# Patient Record
Sex: Female | Born: 1990 | Race: Black or African American | Hispanic: No | Marital: Married | Smoking: Never smoker
Health system: Southern US, Community
[De-identification: ages and names within clinical notes are randomized; demographics above are authoritative.]

## PROBLEM LIST (undated history)

## (undated) DIAGNOSIS — Z789 Other specified health status: Secondary | ICD-10-CM

## (undated) HISTORY — DX: Other specified health status: Z78.9

## (undated) HISTORY — PX: BREAST SURGERY: SHX581

## (undated) HISTORY — PX: WISDOM TOOTH EXTRACTION: SHX21

## (undated) HISTORY — PX: BREAST MASS EXCISION: SHX1267

---

## 2018-03-06 DIAGNOSIS — J392 Other diseases of pharynx: Secondary | ICD-10-CM | POA: Diagnosis not present

## 2018-03-06 DIAGNOSIS — L719 Rosacea, unspecified: Secondary | ICD-10-CM | POA: Diagnosis not present

## 2018-05-07 ENCOUNTER — Other Ambulatory Visit (HOSPITAL_COMMUNITY)
Admission: RE | Admit: 2018-05-07 | Discharge: 2018-05-07 | Disposition: A | Discharge status: Home / Self Care | Payer: BLUE CROSS/BLUE SHIELD | Source: Ambulatory Visit | Attending: Family Medicine | Admitting: Family Medicine

## 2018-05-07 ENCOUNTER — Other Ambulatory Visit: Payer: Self-pay | Admitting: Family Medicine

## 2018-05-07 DIAGNOSIS — Z113 Encounter for screening for infections with a predominantly sexual mode of transmission: Secondary | ICD-10-CM | POA: Diagnosis not present

## 2018-05-07 DIAGNOSIS — Z130 Encounter for screening for diseases of the blood and blood-forming organs and certain disorders involving the immune mechanism: Secondary | ICD-10-CM | POA: Diagnosis not present

## 2018-05-07 DIAGNOSIS — Z124 Encounter for screening for malignant neoplasm of cervix: Secondary | ICD-10-CM | POA: Diagnosis not present

## 2018-05-07 DIAGNOSIS — Z0000 Encounter for general adult medical examination without abnormal findings: Secondary | ICD-10-CM | POA: Diagnosis not present

## 2018-05-11 LAB — CYTOLOGY - PAP

## 2018-05-27 DIAGNOSIS — R0789 Other chest pain: Secondary | ICD-10-CM | POA: Diagnosis not present

## 2018-05-27 DIAGNOSIS — K224 Dyskinesia of esophagus: Secondary | ICD-10-CM | POA: Diagnosis not present

## 2018-11-20 DIAGNOSIS — N912 Amenorrhea, unspecified: Secondary | ICD-10-CM | POA: Diagnosis not present

## 2018-11-20 DIAGNOSIS — N898 Other specified noninflammatory disorders of vagina: Secondary | ICD-10-CM | POA: Diagnosis not present

## 2018-12-11 DIAGNOSIS — Z308 Encounter for other contraceptive management: Secondary | ICD-10-CM | POA: Diagnosis not present

## 2019-02-05 DIAGNOSIS — N912 Amenorrhea, unspecified: Secondary | ICD-10-CM | POA: Diagnosis not present

## 2019-02-05 DIAGNOSIS — Z3491 Encounter for supervision of normal pregnancy, unspecified, first trimester: Secondary | ICD-10-CM | POA: Diagnosis not present

## 2019-02-08 DIAGNOSIS — N912 Amenorrhea, unspecified: Secondary | ICD-10-CM | POA: Diagnosis not present

## 2019-02-11 DIAGNOSIS — O209 Hemorrhage in early pregnancy, unspecified: Secondary | ICD-10-CM | POA: Diagnosis not present

## 2019-02-11 DIAGNOSIS — Z3480 Encounter for supervision of other normal pregnancy, unspecified trimester: Secondary | ICD-10-CM | POA: Diagnosis not present

## 2019-02-11 DIAGNOSIS — Z3481 Encounter for supervision of other normal pregnancy, first trimester: Secondary | ICD-10-CM | POA: Diagnosis not present

## 2019-02-11 DIAGNOSIS — Z3490 Encounter for supervision of normal pregnancy, unspecified, unspecified trimester: Secondary | ICD-10-CM | POA: Diagnosis not present

## 2019-02-11 DIAGNOSIS — Z3687 Encounter for antenatal screening for uncertain dates: Secondary | ICD-10-CM | POA: Diagnosis not present

## 2019-03-04 DIAGNOSIS — Z3687 Encounter for antenatal screening for uncertain dates: Secondary | ICD-10-CM | POA: Diagnosis not present

## 2019-03-04 DIAGNOSIS — Z3481 Encounter for supervision of other normal pregnancy, first trimester: Secondary | ICD-10-CM | POA: Diagnosis not present

## 2019-04-01 DIAGNOSIS — Z3481 Encounter for supervision of other normal pregnancy, first trimester: Secondary | ICD-10-CM | POA: Diagnosis not present

## 2019-04-27 DIAGNOSIS — Z3481 Encounter for supervision of other normal pregnancy, first trimester: Secondary | ICD-10-CM | POA: Diagnosis not present

## 2019-04-27 DIAGNOSIS — Z3480 Encounter for supervision of other normal pregnancy, unspecified trimester: Secondary | ICD-10-CM | POA: Diagnosis not present

## 2019-04-27 DIAGNOSIS — Z3482 Encounter for supervision of other normal pregnancy, second trimester: Secondary | ICD-10-CM | POA: Diagnosis not present

## 2020-10-21 NOTE — L&D Delivery Note (Signed)
Delivery Summary for Rebecca Mckay  Labor Events:   Preterm labor: No data found  Rupture date: 07/11/2021  Rupture time: 6:38 AM  Rupture type: Artificial Intact Bulging bag of water  Fluid Color: Clear  Induction: No data found  Augmentation: No data found  Complications: No data found  Cervical ripening: No data found No data found   No data found     Delivery:   Episiotomy: No data found  Lacerations: No data found  Repair suture: No data found  Repair # of packets: No data found  Blood loss (ml): No data found   Information for the patient's newborn:  Areej, Tayler [[id number]]   Delivery 07/11/2021 12:12 PM by  VBAC, Spontaneous Sex:  female Gestational Age: [redacted]w[redacted]d Delivery Clinician:   Living?:         APGARS  One minute Five minutes Ten minutes  Skin color:        Heart rate:        Grimace:        Muscle tone:        Breathing:        Totals: 8  9      Presentation/position:      Resuscitation:   Cord information:    Disposition of cord blood:     Blood gases sent?  Complications:   Placenta: Delivered:       appearance Newborn Measurements: Weight: 8 lb 3.6 oz (3730 g)  Height: 21.26"  Head circumference:    Chest circumference:    Other providers:    Additional  information: Forceps:   Vacuum:   Breech:   Observed anomalies      Delivery Note At 12:12 PM a viable and healthy female was delivered via VBAC, Spontaneous (Presentation: Compound; ROA posiition,).  APGAR: 8, 9 ; weight  3730 grams.   Placenta status: Spontaneous, Intact.  Cord: 3 vessels with the following complications: None.  Cord pH: not obtained.  Delayed cord clamping was observed.   Anesthesia: Epidural Episiotomy: None Lacerations:  2nd degree perineal Suture Repair: 2.0 vicryl Est. Blood Loss (mL):  250 ml  Mom to postpartum.  Baby to Couplet care / Skin to Skin.  [name], MD 07/11/2021, 1:40 PM

## 2020-12-05 LAB — CBC AND DIFFERENTIAL
HCT: 40 (ref 36–46)
Hemoglobin: 12.4 (ref 12.0–16.0)
Platelets: 212 (ref 150–399)

## 2020-12-05 LAB — OB RESULTS CONSOLE HIV ANTIBODY (ROUTINE TESTING): HIV: NONREACTIVE

## 2020-12-05 LAB — OB RESULTS CONSOLE ABO/RH: RH Type: POSITIVE

## 2020-12-05 LAB — OB RESULTS CONSOLE ANTIBODY SCREEN: Antibody Screen: NEGATIVE

## 2020-12-05 LAB — OB RESULTS CONSOLE HGB/HCT, BLOOD
HCT: 40 (ref 29–41)
Hemoglobin: 12.4

## 2020-12-05 LAB — HEPATITIS C ANTIBODY: HCV Ab: NEGATIVE

## 2020-12-05 LAB — CBC: RBC: 4.37 (ref 3.87–5.11)

## 2020-12-05 LAB — OB RESULTS CONSOLE HEPATITIS B SURFACE ANTIGEN: Hepatitis B Surface Ag: NEGATIVE

## 2020-12-05 LAB — OB RESULTS CONSOLE RUBELLA ANTIBODY, IGM: Rubella: IMMUNE

## 2020-12-05 LAB — HIV ANTIBODY (ROUTINE TESTING W REFLEX): HIV 1&2 Ab, 4th Generation: NONREACTIVE

## 2021-04-17 ENCOUNTER — Encounter: Payer: Self-pay | Admitting: Obstetrics and Gynecology

## 2021-04-17 ENCOUNTER — Other Ambulatory Visit: Payer: Self-pay

## 2021-04-17 ENCOUNTER — Ambulatory Visit (INDEPENDENT_AMBULATORY_CARE_PROVIDER_SITE_OTHER): Payer: Medicaid Other | Admitting: Obstetrics and Gynecology

## 2021-04-17 VITALS — BP 117/79 | HR 96 | Ht 64.0 in | Wt 152.6 lb

## 2021-04-17 DIAGNOSIS — Z3482 Encounter for supervision of other normal pregnancy, second trimester: Secondary | ICD-10-CM | POA: Diagnosis not present

## 2021-04-17 DIAGNOSIS — Z3A27 27 weeks gestation of pregnancy: Secondary | ICD-10-CM

## 2021-04-17 DIAGNOSIS — O34219 Maternal care for unspecified type scar from previous cesarean delivery: Secondary | ICD-10-CM | POA: Diagnosis not present

## 2021-04-17 LAB — POCT URINALYSIS DIPSTICK OB
Bilirubin, UA: NEGATIVE
Blood, UA: NEGATIVE
Glucose, UA: NEGATIVE
Ketones, UA: NEGATIVE
Leukocytes, UA: NEGATIVE
Nitrite, UA: NEGATIVE
Spec Grav, UA: 1.02 (ref 1.010–1.025)
Urobilinogen, UA: 0.2 E.U./dL
pH, UA: 6.5 (ref 5.0–8.0)

## 2021-04-17 NOTE — Progress Notes (Signed)
TRANSFER IN OB HISTORY AND PHYSICAL  SUBJECTIVE:       Rebecca Mckay is a 30 y.o. G35P1001 female, with last menstrual period 10/06/2020, Estimated Date of Delivery: 07/13/21, [redacted]w[redacted]d, presents today for Transition of Prenatal Care.  She and her family have relocated from [place].  EPIC data migration from outside records is accomplished today. Complaints today include: none.      Gynecologic History Patient's last menstrual period was 10/06/2020. Normal Contraception: none Last Pap: normal per patient. Approximately 2 years ago with her last pregnancy.    Obstetric History OB History  Gravida Para Term Preterm AB Living  2 1 1     1   SAB IAB Ectopic Multiple Live Births          1    # Outcome Date GA Lbr Len/2nd Weight Sex Delivery Anes PTL Lv  2 Current           1 Term 10/05/19   7 lb 4 oz (3.289 kg) F CS-Unspec   LIV    Past Medical History:  Diagnosis Date   No pertinent past medical history     Past Surgical History:  Procedure Laterality Date   BREAST MASS EXCISION Left    BREAST SURGERY     CESAREAN SECTION      Current Outpatient Medications on File Prior to Visit  Medication Sig Dispense Refill   Prenatal Vit-Fe Fumarate-FA (PRENATAL MULTIVITAMIN) TABS tablet Take 1 tablet by mouth daily at 12 noon.     No current facility-administered medications on file prior to visit.    No Known Allergies  Social History   Socioeconomic History   Marital status: Married    Spouse name: Not on file   Number of children: 1   Years of education: Not on file   Highest education level: Not on file  Occupational History   Not on file  Tobacco Use   Smoking status: Never   Smokeless tobacco: Never  Vaping Use   Vaping Use: Never used  Substance and Sexual Activity   Alcohol use: Never   Drug use: Never   Sexual activity: Yes  Other Topics Concern   Not on file  Social History Narrative   Not on file   Social Determinants of Health   Financial  Resource Strain: Not on file  Food Insecurity: Not on file  Transportation Needs: Not on file  Physical Activity: Not on file  Stress: Not on file  Social Connections: Not on file  Intimate Partner Violence: Not on file    Family History  Problem Relation Age of Onset   Healthy Mother    Healthy Father    Healthy Maternal Grandmother    Healthy Maternal Grandfather    Breast cancer Paternal Grandmother    Breast cancer Paternal Grandfather     The following portions of the patient's history were reviewed and updated as appropriate: allergies, current medications, past OB history, past medical history, past surgical history, past family history, past social history, and problem list.    OBJECTIVE: Initial Physical Exam (New OB)  GENERAL APPEARANCE: alert, well appearing, in no apparent distress HEAD: normocephalic, atraumatic MOUTH: mucous membranes moist, pharynx normal without lesions THYROID: no thyromegaly or masses present BREASTS: not examined LUNGS: clear to auscultation, no wheezes, rales or rhonchi, symmetric air entry HEART: regular rate and rhythm, no murmurs ABDOMEN: soft, nontender, nondistended, no abnormal masses, no epigastric pain EXTREMITIES: no redness or tenderness in the calves or thighs  SKIN: normal coloration and turgor, no rashes NEUROLOGIC: alert, oriented, normal speech, no focal findings or movement disorder noted  PELVIC EXAM Deferred  ASSESSMENT: Normal pregnancy History of C-Section x 1  PLAN: Supervision of normal pregnancy  - Initial labs reviewed from outside records. - Prenatal vitamins encouraged. - Problem list reviewed and updated. - New OB counseling:  The patient has been given an overview regarding routine prenatal care at our practice.  - Prenatal testing previously performed, low risk female.  - Benefits of Breast Feeding were discussed. The patient is encouraged to consider nursing her baby post partum. Notes she breastfed  for 14 months with last pregnancy.  - To be scheduled for 1 hour glucola next week.  - Needs ultrasound as she did not receive her anatomy scan prior to moving. Will order.  - The patient has Medicaid.  [facility] Medicaid Risk Screening Form completed today   2. History of C-section Patient with prior h/o C-section due to breech presentation (discovered prior to labor). Desires TOLAC.  VBAC calculator score is 74.1%.  Patient is an excellent candidate for TOLAC.  She notes that she was previously counseled about her last OB office regarding risks of TOLAC.   Follow up in 4 weeks.  50% of 30 min visit spent on counseling and coordination of care.    [name], MD [facility]

## 2021-04-17 NOTE — Progress Notes (Signed)
Transfer OB- Pt stated that she was doing well. Pt is having a girl confirmed by genetic testing.

## 2021-04-17 NOTE — Patient Instructions (Signed)
[email or web address]">  Third Trimester of Pregnancy  The third trimester of pregnancy is from week 28 through week 40. This is months 7 through 9. The third trimester is a time when the unborn baby (fetus) is growing rapidly. At the end of the ninth month, the fetus is about 20inches long and weighs 6-10 pounds. Body changes during your third trimester During the third trimester, your body will continue to go through many changes.The changes vary and generally return to normal after your baby is born. Physical changes Your weight will continue to increase. You can expect to gain 25-35 pounds (11-16 kg) by the end of the pregnancy if you begin pregnancy at a normal weight. If you are underweight, you can expect to gain 28-40 lb (about 13-18 kg), and if you are overweight, you can expect to gain 15-25 lb (about 7-11 kg). You may begin to get stretch marks on your hips, abdomen, and breasts. Your breasts will continue to grow and may hurt. A yellow fluid (colostrum) may leak from your breasts. This is the first milk you are producing for your baby. You may have changes in your hair. These can include thickening of your hair, rapid growth, and changes in texture. Some people also have hair loss during or after pregnancy, or hair that feels dry or thin. Your belly button may stick out. You may notice more swelling in your hands, face, or ankles. Health changes You may have heartburn. You may have constipation. You may develop hemorrhoids. You may develop swollen, bulging veins (varicose veins) in your legs. You may have increased body aches in the pelvis, back, or thighs. This is due to weight gain and increased hormones that are relaxing your joints. You may have increased tingling or numbness in your hands, arms, and legs. The skin on your abdomen may also feel numb. You may feel short of breath because of your expanding uterus. Other  changes You may urinate more often because the fetus is moving lower into your pelvis and pressing on your bladder. You may have more problems sleeping. This may be caused by the size of your abdomen, an increased need to urinate, and an increase in your body's metabolism. You may notice the fetus "dropping," or moving lower in your abdomen (lightening). You may have increased vaginal discharge. You may notice that you have pain around your pelvic bone as your uterus distends. Follow these instructions at home: Medicines Follow your health care provider's instructions regarding medicine use. Specific medicines may be either safe or unsafe to take during pregnancy. Do not take any medicines unless approved by your health care provider. Take a prenatal vitamin that contains at least 600 micrograms (mcg) of folic acid. Eating and drinking Eat a healthy diet that includes fresh fruits and vegetables, whole grains, good sources of protein such as meat, eggs, or tofu, and low-fat dairy products. Avoid raw meat and unpasteurized juice, milk, and cheese. These carry germs that can harm you and your baby. Eat 4 or 5 small meals rather than 3 large meals a day. You may need to take these actions to prevent or treat constipation: Drink enough fluid to keep your urine pale yellow. Eat foods that are high in fiber, such as beans, whole grains, and fresh fruits and vegetables. Limit foods that are high in fat and processed sugars, such as fried or sweet foods. Activity Exercise only as directed by your health care provider. Most people can continue their usual exercise routine during pregnancy. Try  to exercise for 30 minutes at least 5 days a week. Stop exercising if you experience contractions in the uterus. Stop exercising if you develop pain or cramping in the lower abdomen or lower back. Avoid heavy lifting. Do not exercise if it is very hot or humid or if you are at a high altitude. If you choose to,  you may continue to have sex unless your health care provider tells you not to. Relieving pain and discomfort Take frequent breaks and rest with your legs raised (elevated) if you have leg cramps or low back pain. Take warm sitz baths to soothe any pain or discomfort caused by hemorrhoids. Use hemorrhoid cream if your health care provider approves. Wear a supportive bra to prevent discomfort from breast tenderness. If you develop varicose veins: Wear support hose as told by your health care provider. Elevate your feet for 15 minutes, 3-4 times a day. Limit salt in your diet. Safety Talk to your health care provider before traveling far distances. Do not use hot tubs, steam rooms, or saunas. Wear your seat belt at all times when driving or riding in a car. Talk with your health care provider if someone is verbally or physically abusive to you. Preparing for birth To prepare for the arrival of your baby: Take prenatal classes to understand, practice, and ask questions about labor and delivery. Visit the hospital and tour the maternity area. Purchase a rear-facing car seat and make sure you know how to install it in your car. Prepare the baby's room or sleeping area. Make sure to remove all pillows and stuffed animals from the baby's crib to prevent suffocation. General instructions Avoid cat litter boxes and soil used by cats. These carry germs that can cause birth defects in the baby. If you have a cat, ask someone to clean the litter box for you. Do not douche or use tampons. Do not use scented sanitary pads. Do not use any products that contain nicotine or tobacco, such as cigarettes, e-cigarettes, and chewing tobacco. If you need help quitting, ask your health care provider. Do not use any herbal remedies, illegal drugs, or medicines that were not prescribed to you. Chemicals in these products can harm your baby. Do not drink alcohol. You will have more frequent prenatal exams during the  third trimester. During a routine prenatal visit, your health care provider will do a physical exam, perform tests, and discuss your overall health. Keep all follow-up visits. This is important. Where to find more information American Pregnancy Association: americanpregnancy.[place] and Gynecologists: PoolDevices.com.pt Office on [employer]: KeywordPortfolios.com.br Contact a health care provider if you have: A fever. Mild pelvic cramps, pelvic pressure, or nagging pain in your abdominal area or lower back. Vomiting or diarrhea. Bad-smelling vaginal discharge or foul-smelling urine. Pain when you urinate. A headache that does not go away when you take medicine. Visual changes or see spots in front of your eyes. Get help right away if: Your water breaks. You have regular contractions less than 5 minutes apart. You have spotting or bleeding from your vagina. You have severe abdominal pain. You have difficulty breathing. You have chest pain. You have fainting spells. You have not felt your baby move for the time period told by your health care provider. You have new or increased pain, swelling, or redness in an arm or leg. Summary The third trimester of pregnancy is from week 28 through week 40 (months 7 through 9). You may have more problems  sleeping. This can be caused by the size of your abdomen, an increased need to urinate, and an increase in your body's metabolism. You will have more frequent prenatal exams during the third trimester. Keep all follow-up visits. This is important. This information is not intended to replace advice given to you by your health care provider. Make sure you discuss any questions you have with your healthcare provider. Document Revised: 03/15/2020 Document Reviewed: 01/20/2020 Elsevier Patient Education  2022 [employer].

## 2021-04-20 ENCOUNTER — Other Ambulatory Visit: Payer: Self-pay

## 2021-04-20 ENCOUNTER — Ambulatory Visit
Admission: RE | Admit: 2021-04-20 | Discharge: 2021-04-20 | Disposition: A | Discharge status: Home / Self Care | Payer: Medicaid Other | Source: Ambulatory Visit | Attending: Obstetrics and Gynecology | Admitting: Obstetrics and Gynecology

## 2021-04-20 DIAGNOSIS — Z3482 Encounter for supervision of other normal pregnancy, second trimester: Secondary | ICD-10-CM | POA: Diagnosis not present

## 2021-04-20 DIAGNOSIS — Z3A27 27 weeks gestation of pregnancy: Secondary | ICD-10-CM | POA: Insufficient documentation

## 2021-04-24 ENCOUNTER — Other Ambulatory Visit: Payer: Self-pay | Admitting: Obstetrics and Gynecology

## 2021-04-24 DIAGNOSIS — O35EXX0 Maternal care for other (suspected) fetal abnormality and damage, fetal genitourinary anomalies, not applicable or unspecified: Secondary | ICD-10-CM

## 2021-04-24 DIAGNOSIS — Z362 Encounter for other antenatal screening follow-up: Secondary | ICD-10-CM

## 2021-04-26 ENCOUNTER — Other Ambulatory Visit: Payer: Self-pay

## 2021-04-26 ENCOUNTER — Other Ambulatory Visit: Payer: Medicaid Other

## 2021-04-26 DIAGNOSIS — Z3482 Encounter for supervision of other normal pregnancy, second trimester: Secondary | ICD-10-CM

## 2021-04-27 LAB — CBC
Hematocrit: 38.8 % (ref 34.0–46.6)
Hemoglobin: 12.7 g/dL (ref 11.1–15.9)
MCH: 28.2 pg (ref 26.6–33.0)
MCHC: 32.7 g/dL (ref 31.5–35.7)
MCV: 86 fL (ref 79–97)
Platelets: 129 10*3/uL — ABNORMAL LOW (ref 150–450)
RBC: 4.5 x10E6/uL (ref 3.77–5.28)
RDW: 13.2 % (ref 11.7–15.4)
WBC: 7.8 10*3/uL (ref 3.4–10.8)

## 2021-04-27 LAB — GLUCOSE, 1 HOUR GESTATIONAL: Gestational Diabetes Screen: 105 mg/dL (ref 65–139)

## 2021-04-27 LAB — RPR: RPR Ser Ql: NONREACTIVE

## 2021-04-27 LAB — HEPATITIS C ANTIBODY: Hep C Virus Ab: 0.1 s/co ratio (ref 0.0–0.9)

## 2021-05-01 ENCOUNTER — Encounter: Payer: Self-pay | Admitting: Obstetrics and Gynecology

## 2021-05-01 ENCOUNTER — Other Ambulatory Visit: Payer: Self-pay

## 2021-05-01 ENCOUNTER — Ambulatory Visit (INDEPENDENT_AMBULATORY_CARE_PROVIDER_SITE_OTHER): Payer: Medicaid Other | Admitting: Obstetrics and Gynecology

## 2021-05-01 VITALS — BP 125/79 | HR 85 | Wt 155.1 lb

## 2021-05-01 DIAGNOSIS — Z3482 Encounter for supervision of other normal pregnancy, second trimester: Secondary | ICD-10-CM

## 2021-05-01 DIAGNOSIS — Z3A29 29 weeks gestation of pregnancy: Secondary | ICD-10-CM

## 2021-05-01 LAB — POCT URINALYSIS DIPSTICK OB
Bilirubin, UA: NEGATIVE
Blood, UA: NEGATIVE
Glucose, UA: NEGATIVE
Ketones, UA: NEGATIVE
Leukocytes, UA: NEGATIVE
Nitrite, UA: NEGATIVE
POC,PROTEIN,UA: NEGATIVE
Spec Grav, UA: 1.01 (ref 1.010–1.025)
Urobilinogen, UA: 0.2 E.U./dL
pH, UA: 7 (ref 5.0–8.0)

## 2021-05-01 NOTE — Progress Notes (Signed)
ROB: No complaints.  Reviewed 1 hour GCT.  Patient continues to desire TOLAC.  Follow-up with MFM scheduled again (for completion of anatomy and fetal hydronephrosis)

## 2021-05-04 ENCOUNTER — Other Ambulatory Visit: Payer: Self-pay

## 2021-05-22 ENCOUNTER — Ambulatory Visit (INDEPENDENT_AMBULATORY_CARE_PROVIDER_SITE_OTHER): Payer: Medicaid Other | Admitting: Obstetrics and Gynecology

## 2021-05-22 ENCOUNTER — Encounter: Payer: Self-pay | Admitting: Obstetrics and Gynecology

## 2021-05-22 ENCOUNTER — Other Ambulatory Visit: Payer: Self-pay

## 2021-05-22 VITALS — BP 125/77 | HR 84 | Wt 161.1 lb

## 2021-05-22 DIAGNOSIS — D696 Thrombocytopenia, unspecified: Secondary | ICD-10-CM | POA: Insufficient documentation

## 2021-05-22 DIAGNOSIS — Z3403 Encounter for supervision of normal first pregnancy, third trimester: Secondary | ICD-10-CM

## 2021-05-22 DIAGNOSIS — O34219 Maternal care for unspecified type scar from previous cesarean delivery: Secondary | ICD-10-CM | POA: Insufficient documentation

## 2021-05-22 DIAGNOSIS — Z3A32 32 weeks gestation of pregnancy: Secondary | ICD-10-CM

## 2021-05-22 DIAGNOSIS — O99113 Other diseases of the blood and blood-forming organs and certain disorders involving the immune mechanism complicating pregnancy, third trimester: Secondary | ICD-10-CM | POA: Insufficient documentation

## 2021-05-22 LAB — POCT URINALYSIS DIPSTICK OB
Bilirubin, UA: NEGATIVE
Blood, UA: NEGATIVE
Glucose, UA: NEGATIVE
Ketones, UA: NEGATIVE
Leukocytes, UA: NEGATIVE
Nitrite, UA: NEGATIVE
Spec Grav, UA: 1.01 (ref 1.010–1.025)
Urobilinogen, UA: 0.2 E.U./dL
pH, UA: 7 (ref 5.0–8.0)

## 2021-05-22 NOTE — Patient Instructions (Signed)
[email or web address]">  Third Trimester of Pregnancy  The third trimester of pregnancy is from week 28 through week 40. This is months 7 through 9. The third trimester is a time when the unborn baby (fetus) is growing rapidly. At the end of the ninth month, the fetus is about 20inches long and weighs 6-10 pounds. Body changes during your third trimester During the third trimester, your body will continue to go through many changes.The changes vary and generally return to normal after your baby is born. Physical changes Your weight will continue to increase. You can expect to gain 25-35 pounds (11-16 kg) by the end of the pregnancy if you begin pregnancy at a normal weight. If you are underweight, you can expect to gain 28-40 lb (about 13-18 kg), and if you are overweight, you can expect to gain 15-25 lb (about 7-11 kg). You may begin to get stretch marks on your hips, abdomen, and breasts. Your breasts will continue to grow and may hurt. A yellow fluid (colostrum) may leak from your breasts. This is the first milk you are producing for your baby. You may have changes in your hair. These can include thickening of your hair, rapid growth, and changes in texture. Some people also have hair loss during or after pregnancy, or hair that feels dry or thin. Your belly button may stick out. You may notice more swelling in your hands, face, or ankles. Health changes You may have heartburn. You may have constipation. You may develop hemorrhoids. You may develop swollen, bulging veins (varicose veins) in your legs. You may have increased body aches in the pelvis, back, or thighs. This is due to weight gain and increased hormones that are relaxing your joints. You may have increased tingling or numbness in your hands, arms, and legs. The skin on your abdomen may also feel numb. You may feel short of breath because of your expanding uterus. Other  changes You may urinate more often because the fetus is moving lower into your pelvis and pressing on your bladder. You may have more problems sleeping. This may be caused by the size of your abdomen, an increased need to urinate, and an increase in your body's metabolism. You may notice the fetus "dropping," or moving lower in your abdomen (lightening). You may have increased vaginal discharge. You may notice that you have pain around your pelvic bone as your uterus distends. Follow these instructions at home: Medicines Follow your health care provider's instructions regarding medicine use. Specific medicines may be either safe or unsafe to take during pregnancy. Do not take any medicines unless approved by your health care provider. Take a prenatal vitamin that contains at least 600 micrograms (mcg) of folic acid. Eating and drinking Eat a healthy diet that includes fresh fruits and vegetables, whole grains, good sources of protein such as meat, eggs, or tofu, and low-fat dairy products. Avoid raw meat and unpasteurized juice, milk, and cheese. These carry germs that can harm you and your baby. Eat 4 or 5 small meals rather than 3 large meals a day. You may need to take these actions to prevent or treat constipation: Drink enough fluid to keep your urine pale yellow. Eat foods that are high in fiber, such as beans, whole grains, and fresh fruits and vegetables. Limit foods that are high in fat and processed sugars, such as fried or sweet foods. Activity Exercise only as directed by your health care provider. Most people can continue their usual exercise routine during pregnancy. Try  to exercise for 30 minutes at least 5 days a week. Stop exercising if you experience contractions in the uterus. Stop exercising if you develop pain or cramping in the lower abdomen or lower back. Avoid heavy lifting. Do not exercise if it is very hot or humid or if you are at a high altitude. If you choose to,  you may continue to have sex unless your health care provider tells you not to. Relieving pain and discomfort Take frequent breaks and rest with your legs raised (elevated) if you have leg cramps or low back pain. Take warm sitz baths to soothe any pain or discomfort caused by hemorrhoids. Use hemorrhoid cream if your health care provider approves. Wear a supportive bra to prevent discomfort from breast tenderness. If you develop varicose veins: Wear support hose as told by your health care provider. Elevate your feet for 15 minutes, 3-4 times a day. Limit salt in your diet. Safety Talk to your health care provider before traveling far distances. Do not use hot tubs, steam rooms, or saunas. Wear your seat belt at all times when driving or riding in a car. Talk with your health care provider if someone is verbally or physically abusive to you. Preparing for birth To prepare for the arrival of your baby: Take prenatal classes to understand, practice, and ask questions about labor and delivery. Visit the hospital and tour the maternity area. Purchase a rear-facing car seat and make sure you know how to install it in your car. Prepare the baby's room or sleeping area. Make sure to remove all pillows and stuffed animals from the baby's crib to prevent suffocation. General instructions Avoid cat litter boxes and soil used by cats. These carry germs that can cause birth defects in the baby. If you have a cat, ask someone to clean the litter box for you. Do not douche or use tampons. Do not use scented sanitary pads. Do not use any products that contain nicotine or tobacco, such as cigarettes, e-cigarettes, and chewing tobacco. If you need help quitting, ask your health care provider. Do not use any herbal remedies, illegal drugs, or medicines that were not prescribed to you. Chemicals in these products can harm your baby. Do not drink alcohol. You will have more frequent prenatal exams during the  third trimester. During a routine prenatal visit, your health care provider will do a physical exam, perform tests, and discuss your overall health. Keep all follow-up visits. This is important. Where to find more information American Pregnancy Association: americanpregnancy.[place] and Gynecologists: PoolDevices.com.pt Office on [employer]: KeywordPortfolios.com.br Contact a health care provider if you have: A fever. Mild pelvic cramps, pelvic pressure, or nagging pain in your abdominal area or lower back. Vomiting or diarrhea. Bad-smelling vaginal discharge or foul-smelling urine. Pain when you urinate. A headache that does not go away when you take medicine. Visual changes or see spots in front of your eyes. Get help right away if: Your water breaks. You have regular contractions less than 5 minutes apart. You have spotting or bleeding from your vagina. You have severe abdominal pain. You have difficulty breathing. You have chest pain. You have fainting spells. You have not felt your baby move for the time period told by your health care provider. You have new or increased pain, swelling, or redness in an arm or leg. Summary The third trimester of pregnancy is from week 28 through week 40 (months 7 through 9). You may have more problems  sleeping. This can be caused by the size of your abdomen, an increased need to urinate, and an increase in your body's metabolism. You will have more frequent prenatal exams during the third trimester. Keep all follow-up visits. This is important. This information is not intended to replace advice given to you by your health care provider. Make sure you discuss any questions you have with your healthcare provider. Document Revised: 03/15/2020 Document Reviewed: 01/20/2020 Elsevier Patient Education  2022 [employer].

## 2021-05-22 NOTE — Progress Notes (Signed)
ROB: Feels some fatigue but knows this is normal. Ultrasound scheduled for 8/18/ to f/u hydronephrosis and incomplete anatomy. Discussed pain management in labor.  Discussed breastfeeding, notes she breastfeed for 14 months with last pregnancy. Discussed contraception, plans on lactational amenorrhea.  Mild gestational thrombocytopenia, will f/u at 36 weeks. RTC in 2 weeks.   The following were addressed during this visit:  Breastfeeding Education - The importance of exclusive breastfeeding    Comments: Provides antibodies, Lower risk of breast and ovarian cancers, and type-2 diabetes,Helps your body recover, Reduced chance of SIDS.   - Nonpharmacological pain relief methods for labor    Comments: Deep breathing, focusing on pleasant things, movement and walking, heating pads or cold compress, massage and relaxation, continuous support from someone you trust, and Doulas   - The importance of early skin-to-skin contact    Comments:  Keeps baby warm and secure, helps keep baby's blood sugar up and breathing steady, easier to bond and breastfeed, and helps calm baby.  - Rooming-in on a 24-hour basis    Comments: Easier to learn baby's feeding cues, easier to bond and get to know each other, and encourages milk production.   - Exclusive breastfeeding for the first 6 months    Comments: Builds a healthy milk supply and keeps it up, protects baby from sickness and disease, and breastmilk has everything your baby needs for the first 6 months.

## 2021-05-22 NOTE — Progress Notes (Signed)
ROB: She is doing well, a little fatigued. She has no new concerns.

## 2021-06-04 NOTE — Patient Instructions (Signed)

## 2021-06-05 ENCOUNTER — Encounter: Payer: Self-pay | Admitting: Obstetrics and Gynecology

## 2021-06-05 ENCOUNTER — Other Ambulatory Visit: Payer: Self-pay

## 2021-06-05 ENCOUNTER — Ambulatory Visit (INDEPENDENT_AMBULATORY_CARE_PROVIDER_SITE_OTHER): Payer: Medicaid Other | Admitting: Obstetrics and Gynecology

## 2021-06-05 ENCOUNTER — Other Ambulatory Visit: Payer: Self-pay | Admitting: Obstetrics and Gynecology

## 2021-06-05 VITALS — BP 116/76 | HR 106 | Wt 162.4 lb

## 2021-06-05 DIAGNOSIS — O35EXX0 Maternal care for other (suspected) fetal abnormality and damage, fetal genitourinary anomalies, not applicable or unspecified: Secondary | ICD-10-CM

## 2021-06-05 DIAGNOSIS — Z363 Encounter for antenatal screening for malformations: Secondary | ICD-10-CM

## 2021-06-05 DIAGNOSIS — Z3A34 34 weeks gestation of pregnancy: Secondary | ICD-10-CM

## 2021-06-05 DIAGNOSIS — Z3483 Encounter for supervision of other normal pregnancy, third trimester: Secondary | ICD-10-CM

## 2021-06-05 DIAGNOSIS — O358XX0 Maternal care for other (suspected) fetal abnormality and damage, not applicable or unspecified: Secondary | ICD-10-CM

## 2021-06-05 LAB — POCT URINALYSIS DIPSTICK OB
Glucose, UA: NEGATIVE
Leukocytes, UA: NEGATIVE
POC,PROTEIN,UA: NEGATIVE
Spec Grav, UA: 1.01 (ref 1.010–1.025)
pH, UA: 7 (ref 5.0–8.0)

## 2021-06-05 NOTE — Progress Notes (Signed)
ROB: She feels tired today, has no new concerns.

## 2021-06-05 NOTE — Progress Notes (Signed)
ROB: No complaints.  Daily fetal movement.  Ultrasound next week for completion of anatomy and hydronephrosis follow-up.  Cultures next visit.  CBC for thrombocytopenia ordered next visit.  Patient still planning on TOLAC

## 2021-06-07 ENCOUNTER — Ambulatory Visit: Payer: Medicaid Other | Attending: Maternal & Fetal Medicine

## 2021-06-07 ENCOUNTER — Other Ambulatory Visit: Payer: Self-pay

## 2021-06-07 DIAGNOSIS — Z3A34 34 weeks gestation of pregnancy: Secondary | ICD-10-CM | POA: Diagnosis not present

## 2021-06-07 DIAGNOSIS — O358XX0 Maternal care for other (suspected) fetal abnormality and damage, not applicable or unspecified: Secondary | ICD-10-CM | POA: Diagnosis not present

## 2021-06-07 DIAGNOSIS — Z363 Encounter for antenatal screening for malformations: Secondary | ICD-10-CM | POA: Insufficient documentation

## 2021-06-07 DIAGNOSIS — O35EXX0 Maternal care for other (suspected) fetal abnormality and damage, fetal genitourinary anomalies, not applicable or unspecified: Secondary | ICD-10-CM

## 2021-06-19 ENCOUNTER — Encounter: Payer: Self-pay | Admitting: Obstetrics and Gynecology

## 2021-06-19 ENCOUNTER — Ambulatory Visit (INDEPENDENT_AMBULATORY_CARE_PROVIDER_SITE_OTHER): Payer: Medicaid Other | Admitting: Obstetrics and Gynecology

## 2021-06-19 ENCOUNTER — Other Ambulatory Visit: Payer: Self-pay

## 2021-06-19 VITALS — BP 134/81 | HR 90 | Wt 166.3 lb

## 2021-06-19 DIAGNOSIS — O34219 Maternal care for unspecified type scar from previous cesarean delivery: Secondary | ICD-10-CM

## 2021-06-19 DIAGNOSIS — Z3A36 36 weeks gestation of pregnancy: Secondary | ICD-10-CM

## 2021-06-19 DIAGNOSIS — D696 Thrombocytopenia, unspecified: Secondary | ICD-10-CM

## 2021-06-19 DIAGNOSIS — O35EXX0 Maternal care for other (suspected) fetal abnormality and damage, fetal genitourinary anomalies, not applicable or unspecified: Secondary | ICD-10-CM | POA: Insufficient documentation

## 2021-06-19 DIAGNOSIS — O99119 Other diseases of the blood and blood-forming organs and certain disorders involving the immune mechanism complicating pregnancy, unspecified trimester: Secondary | ICD-10-CM

## 2021-06-19 DIAGNOSIS — O479 False labor, unspecified: Secondary | ICD-10-CM

## 2021-06-19 DIAGNOSIS — O358XX0 Maternal care for other (suspected) fetal abnormality and damage, not applicable or unspecified: Secondary | ICD-10-CM

## 2021-06-19 DIAGNOSIS — Z3483 Encounter for supervision of other normal pregnancy, third trimester: Secondary | ICD-10-CM

## 2021-06-19 LAB — POCT URINALYSIS DIPSTICK OB
Appearance: NEGATIVE
Bilirubin, UA: NEGATIVE
Blood, UA: NEGATIVE
Glucose, UA: NEGATIVE
Ketones, UA: NEGATIVE
Leukocytes, UA: NEGATIVE
Nitrite, UA: NEGATIVE
Odor: NEGATIVE
POC,PROTEIN,UA: NEGATIVE
Spec Grav, UA: <=1.005 — AB (ref 1.010–1.025)
Urobilinogen, UA: 0.2 E.U./dL
pH, UA: 7 (ref 5.0–8.0)

## 2021-06-19 NOTE — Patient Instructions (Signed)
Signs and Symptoms of Labor Labor is the body's natural process of moving the baby and the placenta out of the uterus. The process of labor usually starts when the baby is full-term, between 82 and 40 weeks of pregnancy. Signs and symptoms that you are close to going into labor As your body prepares for labor and the birth of your baby, you may notice the following symptoms in the weeks and days before true labor starts: Passing a small amount of thick, bloody mucus from your vagina. This is called normal bloody show or losing your mucus plug. This may happen more than a week before labor begins, or right before labor begins, as the opening of the cervix starts to widen (dilate). For some women, the entire mucus plug passes at once. For others, pieces of the mucus plug may gradually pass over several days. Your baby moving (dropping) lower in your pelvis to get into position for birth (lightening). When this happens, you may feel more pressure on your bladder and pelvic bone and less pressure on your ribs. This may make it easier to breathe. It may also cause you to need to urinate more often and have problems with bowel movements. Having "practice contractions," also called Braxton Hicks contractions or false labor. These occur at irregular (unevenly spaced) intervals that are more than 10 minutes apart. False labor contractions are common after exercise or sexual activity. They will stop if you change position, rest, or drink fluids. These contractions are usually mild and do not get stronger over time. They may feel like: A backache or back pain. Mild cramps, similar to menstrual cramps. Tightening or pressure in your abdomen. Other early symptoms include: Nausea or loss of appetite. Diarrhea. Having a sudden burst of energy, or feeling very tired. Mood changes. Having trouble sleeping. Signs and symptoms that labor has begun Signs that you are in labor may include: Having contractions that come  at regular (evenly spaced) intervals and increase in intensity. This may feel like more intense tightening or pressure in your abdomen that moves to your back. Contractions may also feel like rhythmic pain in your upper thighs or back that comes and goes at regular intervals. For first-time mothers, this change in intensity of contractions often occurs at a more gradual pace. Women who have given birth before may notice a more rapid progression of contraction changes. Feeling pressure in the vaginal area. Your water breaking (rupture of membranes). This is when the sac of fluid that surrounds your baby breaks. Fluid leaking from your vagina may be clear or blood-tinged. Labor usually starts within 24 hours of your water breaking, but it may take longer to begin. Some women may feel a sudden gush of fluid. Others notice that their underwear repeatedly becomes damp. Follow these instructions at home:  When labor starts, or if your water breaks, call your health care provider or nurse care line. Based on your situation, they will determine when you should go in for an exam. During early labor, you may be able to rest and manage symptoms at home. Some strategies to try at home include: Breathing and relaxation techniques. Taking a warm bath or shower. Listening to music. Using a heating pad on the lower back for pain. If you are directed to use heat: Place a towel between your skin and the heat source. Leave the heat on for 20-30 minutes. Remove the heat if your skin turns bright red. This is especially important if you are unable to  feel pain, heat, or cold. You may have a greater risk of getting burned. Contact a health care provider if: Your labor has started. Your water breaks. Get help right away if: You have painful, regular contractions that are 5 minutes apart or less. Labor starts before you are [redacted] weeks along in your pregnancy. You have a fever. You have bright red blood coming from  your vagina. You do not feel your baby moving. You have a severe headache with or without vision problems. You have severe nausea, vomiting, or diarrhea. You have chest pain or shortness of breath. These symptoms may represent a serious problem that is an emergency. Do not wait to see if the symptoms will go away. Get medical help right away. Call your local emergency services (911 in the [place] Do not drive yourself to the hospital. Summary Labor is your body's natural process of moving your baby and the placenta out of your uterus. The process of labor usually starts when your baby is full-term, between 6 and 40 weeks of pregnancy. When labor starts, or if your water breaks, call your health care provider or nurse care line. Based on your situation, they will determine when you should go in for an exam. This information is not intended to replace advice given to you by your health care provider. Make sure you discuss any questions you have with your health care provider. Document Revised: 07/29/2020 Document Reviewed: 07/29/2020 Elsevier Patient Education  2022 [employer].

## 2021-06-19 NOTE — Progress Notes (Signed)
ROB: Complains of worsening [employer].  Desires cervical exam today. Discussed signs/symptoms of labor. 36 week cultures performed.  Also will repeat CBC for gestational thrombocytopenia. Has [place] this week for f/u of fetal right hydronephrosis, recommending Pediatric Urology evaluation either prior to delivery or postnatal, MFM to arrange.  RTC in 1 week.

## 2021-06-19 NOTE — Progress Notes (Signed)
ROB- 36 week cultures. MFM- next week.

## 2021-06-20 ENCOUNTER — Other Ambulatory Visit: Payer: Self-pay | Admitting: Maternal & Fetal Medicine

## 2021-06-20 DIAGNOSIS — O358XX0 Maternal care for other (suspected) fetal abnormality and damage, not applicable or unspecified: Secondary | ICD-10-CM

## 2021-06-20 DIAGNOSIS — O35EXX0 Maternal care for other (suspected) fetal abnormality and damage, fetal genitourinary anomalies, not applicable or unspecified: Secondary | ICD-10-CM

## 2021-06-20 LAB — CBC
Hematocrit: 39.6 % (ref 34.0–46.6)
Hemoglobin: 13 g/dL (ref 11.1–15.9)
MCH: 28 pg (ref 26.6–33.0)
MCHC: 32.8 g/dL (ref 31.5–35.7)
MCV: 85 fL (ref 79–97)
Platelets: 83 10*3/uL — CL (ref 150–450)
RBC: 4.64 x10E6/uL (ref 3.77–5.28)
RDW: 14 % (ref 11.7–15.4)
WBC: 6.3 10*3/uL (ref 3.4–10.8)

## 2021-06-21 LAB — STREP GP B NAA: Strep Gp B NAA: NEGATIVE

## 2021-06-22 LAB — GC/CHLAMYDIA PROBE AMP
Chlamydia trachomatis, NAA: NEGATIVE
Neisseria Gonorrhoeae by PCR: NEGATIVE

## 2021-06-26 ENCOUNTER — Other Ambulatory Visit: Payer: Self-pay

## 2021-06-26 ENCOUNTER — Ambulatory Visit (INDEPENDENT_AMBULATORY_CARE_PROVIDER_SITE_OTHER): Payer: Medicaid Other | Admitting: Obstetrics and Gynecology

## 2021-06-26 ENCOUNTER — Encounter: Payer: Self-pay | Admitting: Obstetrics and Gynecology

## 2021-06-26 ENCOUNTER — Ambulatory Visit: Payer: Medicaid Other | Attending: Maternal & Fetal Medicine

## 2021-06-26 VITALS — BP 122/84 | HR 98 | Wt 168.7 lb

## 2021-06-26 DIAGNOSIS — Z363 Encounter for antenatal screening for malformations: Secondary | ICD-10-CM | POA: Insufficient documentation

## 2021-06-26 DIAGNOSIS — O35EXX0 Maternal care for other (suspected) fetal abnormality and damage, fetal genitourinary anomalies, not applicable or unspecified: Secondary | ICD-10-CM

## 2021-06-26 DIAGNOSIS — O358XX0 Maternal care for other (suspected) fetal abnormality and damage, not applicable or unspecified: Secondary | ICD-10-CM

## 2021-06-26 DIAGNOSIS — O99113 Other diseases of the blood and blood-forming organs and certain disorders involving the immune mechanism complicating pregnancy, third trimester: Secondary | ICD-10-CM

## 2021-06-26 DIAGNOSIS — Z3A37 37 weeks gestation of pregnancy: Secondary | ICD-10-CM | POA: Diagnosis not present

## 2021-06-26 DIAGNOSIS — Z3483 Encounter for supervision of other normal pregnancy, third trimester: Secondary | ICD-10-CM

## 2021-06-26 DIAGNOSIS — O34219 Maternal care for unspecified type scar from previous cesarean delivery: Secondary | ICD-10-CM | POA: Diagnosis not present

## 2021-06-26 DIAGNOSIS — D696 Thrombocytopenia, unspecified: Secondary | ICD-10-CM

## 2021-06-26 LAB — POCT URINALYSIS DIPSTICK OB
Bilirubin, UA: NEGATIVE
Blood, UA: NEGATIVE
Glucose, UA: NEGATIVE
Ketones, UA: NEGATIVE
Nitrite, UA: NEGATIVE
POC,PROTEIN,UA: NEGATIVE
Spec Grav, UA: 1.01 (ref 1.010–1.025)
Urobilinogen, UA: 0.2 E.U./dL
pH, UA: 7 (ref 5.0–8.0)

## 2021-06-26 MED ORDER — PREDNISONE 10 MG (21) PO TBPK: ORAL_TABLET | ORAL | 0 refills | Status: DC

## 2021-06-26 NOTE — Patient Instructions (Signed)
Breastfeeding and Breast Care It is normal to have some problems when you start to breastfeed your new baby. But there are things that you can do to take care of yourself and help prevent problems. This includes keeping your breasts healthy and making sure that your baby's mouth attaches (latches) properly to your nipple for feedings. Work with your doctor or breastfeeding specialist to find what works best for you. How does self-care benefit me? If you keep your breasts healthy and you let your baby attach to your nipples in the right way, you will avoid these problems: Cracked or sore nipples. Breasts becoming overfilled with milk. Plugged milk ducts. Low milk supply. Breast swelling or infection. How does self-care benefit my baby? By preventing problems with your breasts, you will ensure that your baby will feed well and will gain the right amount of weight. What actions can I take to care for myself during breastfeeding? Best ways to breastfeed Always make sure that your baby latches properly to breastfeed. Make sure that your baby is in a proper position. Try different breastfeeding positions to find one that works best for you and your baby. Breastfeed when you feel like you need to make your breasts less full or when your baby shows signs of hunger. This is called "breastfeeding on demand." Do not delay feedings. Try to relax when it is time to feed your baby. This helps your body release milk from your breast. To help increase milk flow, do these things before feeding: Remove a small amount of milk from your breast. Use a pump or squeeze with your hand. Apply warm, moist heat to your breast. Do this in the shower or use hand towels soaked with warm water. Massage your breasts. Do this when you are breastfeeding as well. Caring for your breasts   To help your breasts stay healthy and keep them from getting too dry: Avoid using soap on your nipples. Let your nipples air-dry for 3-4  minutes after each feeding. Do not use things like a hair dryer to dry your breasts. This can make the skin dry and will cause irritation and pain. Use only cotton bra pads to soak up breast milk that leaks. Change the pads if they become soaked with milk. If you use bra pads that can be thrown away, change them often. Put some lanolin on your nipples after breastfeeding. Pure lanolin does not need to be washed off your nipple before you feed your baby again. Pure lanolin is not harmful to your baby. Rub some breast milk into your nipples: Use your hand to squeeze out a few drops of breast milk. Gently massage the milk into your nipples. Let your nipples air-dry. Wear a supportive nursing bra. Avoid wearing: Tight clothing. Underwire bras or bras that put pressure on your breasts. Use ice to help relieve pain or swelling of your breasts: Put ice in a plastic bag. Place a towel between your skin and the bag. Leave the ice on for 20 minutes, 2-3 times a day. Follow these instructions at home: Drink enough fluid to keep your pee (urine) pale yellow. Get plenty of rest. Sleep when your baby sleeps. Talk to your doctor or breastfeeding specialist before taking any herbal supplements. Eat a balanced diet. This includes fruits, vegetables, whole grains, lean proteins, and dairy or dairy alternatives Contact a health care provider if: You have nipple pain. You have cracking or soreness in your nipples that lasts longer than 1 week. Your breasts are  overfilled with milk, and this lasts longer than 48 hours. You have a fever. You have pus-like fluid coming from your nipple. You have redness, a rash, swelling, itching, or burning on your breast. Your baby does not gain weight. Your baby loses weight. Your baby is not feeding regularly or is very sleepy and lacks energy. Summary There are things that you can do to take care of yourself and help prevent many common breastfeeding  problems. Always make sure that your baby's mouth attaches (latches) to your nipple properly to breastfeed. Keep your nipples from getting too dry, drink plenty of fluid, and get plenty of rest. Feed on demand. Do not delay feedings. This information is not intended to replace advice given to you by your health care provider. Make sure you discuss any questions you have with your healthcare provider. Document Revised: 03/28/2020 Document Reviewed: 03/28/2020 Elsevier Patient Education  2022 [employer]     Third Trimester of Pregnancy  The third trimester of pregnancy is from week 28 through week 40. This is also called months 7 through 9. This trimester is when your unborn baby (fetus) is growing very fast. At the end of the ninth month, the unborn baby is about20 inches long. It weighs about 6-10 pounds. Body changes during your third trimester Your body continues to go through many changes during this time. The changesvary and generally return to normal after the baby is born. Physical changes Your weight will continue to increase. You may gain 25-35 pounds (11-16 kg) by the end of the pregnancy. If you are underweight, you may gain 28-40 lb (about 13-18 kg). If you are overweight, you may gain 15-25 lb (about 7-11 kg). You may start to get stretch marks on your hips, belly (abdomen), and breasts. Your breasts will continue to grow and may hurt. A yellow fluid (colostrum) may leak from your breasts. This is the first milk you are making for your baby. You may have changes in your hair. Your belly button may stick out. You may have more swelling in your hands, face, or ankles. Health changes You may have heartburn. You may have trouble pooping (constipation). You may get hemorrhoids. These are swollen veins in the butt that can itch or get painful. You may have swollen veins (varicose veins) in your legs. You may have more body aches in the pelvis, back, or thighs. You may have more  tingling or numbness in your hands, arms, and legs. The skin on your belly may also feel numb. You may feel short of breath as your womb (uterus) gets bigger. Other changes You may pee (urinate) more often. You may have more problems sleeping. You may notice the unborn baby "dropping," or moving lower in your belly. You may have more discharge coming from your vagina. Your joints may feel loose, and you may have pain around your pelvic bone. Follow these instructions at home: Medicines Take over-the-counter and prescription medicines only as told by your doctor. Some medicines are not safe during pregnancy. Take a prenatal vitamin that contains at least 600 micrograms (mcg) of folic acid. Eating and drinking Eat healthy meals that include: Fresh fruits and vegetables. Whole grains. Good sources of protein, such as meat, eggs, or tofu. Low-fat dairy products. Avoid raw meat and unpasteurized juice, milk, and cheese. These carry germs that can harm you and your baby. Eat 4 or 5 small meals rather than 3 large meals a day. You may need to take these actions to prevent   or treat trouble pooping: Drink enough fluids to keep your pee (urine) pale yellow. Eat foods that are high in fiber. These include beans, whole grains, and fresh fruits and vegetables. Limit foods that are high in fat and sugar. These include fried or sweet foods. Activity Exercise only as told by your doctor. Stop exercising if you start to have cramps in your womb. Avoid heavy lifting. Do not exercise if it is too hot or too humid, or if you are in a place of great height (high altitude). If you choose to, you may have sex unless your doctor tells you not to. Relieving pain and discomfort Take breaks often, and rest with your legs raised (elevated) if you have leg cramps or low back pain. Take warm water baths (sitz baths) to soothe pain or discomfort caused by hemorrhoids. Use hemorrhoid cream if your doctor  approves. Wear a good support bra if your breasts are tender. If you develop bulging, swollen veins in your legs: Wear support hose as told by your doctor. Raise your feet for 15 minutes, 3-4 times a day. Limit salt in your food. Safety Talk to your doctor before traveling far distances. Do not use hot tubs, steam rooms, or saunas. Wear your seat belt at all times when you are in a car. Talk with your doctor if someone is hurting you or yelling at you a lot. Preparing for your baby's arrival To prepare for the arrival of your baby: Take prenatal classes. Visit the hospital and tour the maternity area. Buy a rear-facing car seat. Learn how to install it in your car. Prepare the baby's room. Take out all pillows and stuffed animals from the baby's crib. General instructions Avoid cat litter boxes and soil used by cats. These carry germs that can cause harm to the baby and can cause a loss of your baby by miscarriage or stillbirth. Do not douche or use tampons. Do not use scented sanitary pads. Do not smoke or use any products that contain nicotine or tobacco. If you need help quitting, ask your doctor. Do not drink alcohol. Do not use herbal medicines, illegal drugs, or medicines that were not approved by your doctor. Chemicals in these products can affect your baby. Keep all follow-up visits. This is important. Where to find more information American Pregnancy Association: americanpregnancy.org American College of Obstetricians and Gynecologists: www.acog.org Office on Women's Health: womenshealth.gov/pregnancy Contact a doctor if: You have a fever. You have mild cramps or pressure in your lower belly. You have a nagging pain in your belly area. You vomit, or you have watery poop (diarrhea). You have bad-smelling fluid coming from your vagina. You have pain when you pee, or your pee smells bad. You have a headache that does not go away when you take medicine. You have changes in  how you see, or you see spots in front of your eyes. Get help right away if: Your water breaks. You have regular contractions that are less than 5 minutes apart. You are spotting or bleeding from your vagina. You have very bad belly cramps or pain. You have trouble breathing. You have chest pain. You faint. You have not felt the baby move for the amount of time told by your doctor. You have new or increased pain, swelling, or redness in an arm or leg. Summary The third trimester is from week 28 through week 40 (months 7 through 9). This is the time when your unborn baby is growing very fast. During this   this time, your discomfort may increase as you gain weight and as your baby grows. Get ready for your baby to arrive by taking prenatal classes, buying a rear-facing car seat, and preparing the baby's room. Get help right away if you are bleeding from your vagina, you have chest pain and trouble breathing, or you have not felt the baby move for the amount of time told by your doctor. This information is not intended to replace advice given to you by your health care provider. Make sure you discuss any questions you have with your health care provider. Document Revised: 03/15/2020 Document Reviewed: 01/20/2020 Elsevier Patient Education  2022 [employer].

## 2021-06-26 NOTE — Progress Notes (Signed)
ROB: Doing well, no major issues. Notes MFM does not require any further visits, recommending f/u with Ped Urology postpartum. 36 week cultures negative however platelet count noted at 83K. Discussed course of steroids to boost levels. Patient ok to try. To begin at 38 weeks. Discussed possibility of IOL after 39 weeks depending on how high levels reach after steroid course. If no resolution postpartum, can refer to Hematology.  RTC in 1 week.

## 2021-06-26 NOTE — Progress Notes (Signed)
  OB-Pt present for routine prenatal care. Pt stated fetal movement present; braxton hick contractions present; no vaginal bleeding and no changes in vaginal discharge.     Pt c/o regular pregnancy pains and pressure like pelvic, vaginal and lower abd.

## 2021-07-02 NOTE — Patient Instructions (Signed)

## 2021-07-03 ENCOUNTER — Other Ambulatory Visit: Payer: Self-pay

## 2021-07-03 ENCOUNTER — Encounter: Payer: Self-pay | Admitting: Obstetrics and Gynecology

## 2021-07-03 ENCOUNTER — Ambulatory Visit (INDEPENDENT_AMBULATORY_CARE_PROVIDER_SITE_OTHER): Payer: Medicaid Other | Admitting: Obstetrics and Gynecology

## 2021-07-03 VITALS — BP 130/84 | HR 91 | Wt 171.5 lb

## 2021-07-03 DIAGNOSIS — Z3483 Encounter for supervision of other normal pregnancy, third trimester: Secondary | ICD-10-CM

## 2021-07-03 DIAGNOSIS — Z3A38 38 weeks gestation of pregnancy: Secondary | ICD-10-CM

## 2021-07-03 DIAGNOSIS — Z3A34 34 weeks gestation of pregnancy: Secondary | ICD-10-CM

## 2021-07-03 LAB — POCT URINALYSIS DIPSTICK OB
Bilirubin, UA: NEGATIVE
Blood, UA: NEGATIVE
Glucose, UA: NEGATIVE
Ketones, UA: NEGATIVE
Leukocytes, UA: NEGATIVE
Nitrite, UA: NEGATIVE
POC,PROTEIN,UA: NEGATIVE
Spec Grav, UA: <=1.005 — AB (ref 1.010–1.025)
Urobilinogen, UA: 0.2 E.U./dL
pH, UA: 6.5 (ref 5.0–8.0)

## 2021-07-03 NOTE — Progress Notes (Addendum)
   OB-Pt present for routine prenatal care. Pt stated fetal movement present; braxton hick contractions present; no vaginal bleeding and no changes in vaginal discharge.     Pt c/o lower abd pressure.  Pt declined flu vaccine and will get vaccine at next visit.

## 2021-07-03 NOTE — Progress Notes (Signed)
ROB: No complaints.  Irregular daily contractions.  Signs and symptoms of labor reviewed.  Patient took her tapered dose of steroids as prescribed.  Follow-up CBC today.

## 2021-07-03 NOTE — Addendum Note (Signed)
Addended by: Lorinda Creed on: 07/03/2021 08:05 AM   Modules accepted: Orders

## 2021-07-04 LAB — CBC WITH DIFFERENTIAL/PLATELET
Basophils Absolute: 0 10*3/uL (ref 0.0–0.2)
Basos: 0 %
EOS (ABSOLUTE): 0.1 10*3/uL (ref 0.0–0.4)
Eos: 1 %
Hematocrit: 38.5 % (ref 34.0–46.6)
Hemoglobin: 12.7 g/dL (ref 11.1–15.9)
Immature Grans (Abs): 0.1 10*3/uL (ref 0.0–0.1)
Immature Granulocytes: 1 %
Lymphocytes Absolute: 1.8 10*3/uL (ref 0.7–3.1)
Lymphs: 19 %
MCH: 28.2 pg (ref 26.6–33.0)
MCHC: 33 g/dL (ref 31.5–35.7)
MCV: 85 fL (ref 79–97)
Monocytes Absolute: 0.9 10*3/uL (ref 0.1–0.9)
Monocytes: 9 %
Neutrophils Absolute: 6.5 10*3/uL (ref 1.4–7.0)
Neutrophils: 70 %
Platelets: 81 10*3/uL — CL (ref 150–450)
RBC: 4.51 x10E6/uL (ref 3.77–5.28)
RDW: 13.6 % (ref 11.7–15.4)
WBC: 9.4 10*3/uL (ref 3.4–10.8)

## 2021-07-09 NOTE — Patient Instructions (Addendum)
Influenza (Flu) Vaccine (Inactivated or Recombinant): What You Need to Know 1. Why get vaccinated? Influenza vaccine can prevent influenza (flu). Flu is a contagious disease that spreads around the [place] every year, usually between October and May. Anyone can get the flu, but it is more dangerous for some people. Infants and young children, people 65 years and older, pregnant people, and people with certain health conditions or a weakened immune system are at greatest risk of flu complications. Pneumonia, bronchitis, sinus infections, and ear infections are examples of flu-related complications. If you have a medical condition, such as heart disease, cancer, or diabetes, flu can make it worse. Flu can cause fever and chills, sore throat, muscle aches, fatigue, cough, headache, and runny or stuffy nose. Some people may have vomiting and diarrhea, though this is more common in children than adults. In an average year, thousands of people in the [place] die from flu, and many more are hospitalized. Flu vaccine prevents millions of illnesses and flu-related visits to the doctor each year. 2. Influenza vaccines CDC recommends everyone 6 months and older get vaccinated every flu season. Children 6 months through 8 years of age may need 2 doses during a single flu season. Everyone else needs only 1 dose each flu season. It takes about 2 weeks for protection to develop after vaccination. There are many flu viruses, and they are always changing. Each year a new flu vaccine is made to protect against the influenza viruses believed to be likely to cause disease in the upcoming flu season. Even when the vaccine doesn't exactly match these viruses, it may still provide some protection. Influenza vaccine does not cause flu. Influenza vaccine may be given at the same time as other vaccines. 3. Talk with your health care provider Tell your vaccination provider if the person getting the vaccine: Has had  an allergic reaction after a previous dose of influenza vaccine, or has any severe, life-threatening allergies Has ever had Guillain-Barr Syndrome (also called "GBS") In some cases, your health care provider may decide to postpone influenza vaccination until a future visit. Influenza vaccine can be administered at any time during pregnancy. People who are or will be pregnant during influenza season should receive inactivated influenza vaccine. People with minor illnesses, such as a cold, may be vaccinated. People who are moderately or severely ill should usually wait until they recover before getting influenza vaccine. Your health care provider can give you more information. 4. Risks of a vaccine reaction Soreness, redness, and swelling where the shot is given, fever, muscle aches, and headache can happen after influenza vaccination. There may be a very small increased risk of Guillain-Barr Syndrome (GBS) after inactivated influenza vaccine (the flu shot). Young children who get the flu shot along with pneumococcal vaccine (PCV13) and/or DTaP vaccine at the same time might be slightly more likely to have a seizure caused by fever. Tell your health care provider if a child who is getting flu vaccine has ever had a seizure. People sometimes faint after medical procedures, including vaccination. Tell your provider if you feel dizzy or have vision changes or ringing in the ears. As with any medicine, there is a very remote chance of a vaccine causing a severe allergic reaction, other serious injury, or death. 5. What if there is a serious problem? An allergic reaction could occur after the vaccinated person leaves the clinic. If you see signs of a severe allergic reaction (hives, swelling of the face and throat, difficulty breathing,   a fast heartbeat, dizziness, or weakness), call 9-1-1 and get the person to the nearest hospital. For other signs that concern you, call your health care provider. Adverse  reactions should be reported to the Vaccine Adverse Event Reporting System (VAERS). Your health care provider will usually file this report, or you can do it yourself. Visit the VAERS website at www.vaers.hhs.gov or call 1-800-822-7967. VAERS is only for reporting reactions, and VAERS staff members do not give medical advice. 6. The National Vaccine Injury Compensation Program The National Vaccine Injury Compensation Program (VICP) is a federal program that was created to compensate people who may have been injured by certain vaccines. Claims regarding alleged injury or death due to vaccination have a time limit for filing, which may be as short as two years. Visit the VICP website at www.hrsa.gov/vaccinecompensation or call 1-800-338-2382 to learn about the program and about filing a claim. 7. How can I learn more? Ask your health care provider. Call your local or state health department. Visit the website of the Food and Drug Administration (FDA) for vaccine package inserts and additional information at www.fda.gov/vaccines-blood-biologics/vaccines. Contact the Centers for Disease Control and Prevention (CDC): Call 1-800-232-4636 (1-800-CDC-INFO) or Visit CDC's website at www.cdc.gov/flu. Vaccine Information Statement Inactivated Influenza Vaccine (05/26/2020) This information is not intended to replace advice given to you by your health care provider. Make sure you discuss any questions you have with your health care provider. Document Revised: 07/13/2020 Document Reviewed: 07/13/2020 Elsevier Patient Education  2022 [employer] Inc[employer]  

## 2021-07-10 ENCOUNTER — Ambulatory Visit (INDEPENDENT_AMBULATORY_CARE_PROVIDER_SITE_OTHER): Payer: Medicaid Other | Admitting: Obstetrics and Gynecology

## 2021-07-10 ENCOUNTER — Inpatient Hospital Stay
Admission: EM | Admit: 2021-07-10 | Discharge: 2021-07-12 | DRG: 807 | Disposition: A | Discharge status: Home / Self Care | Payer: Medicaid Other | Attending: Obstetrics and Gynecology | Admitting: Obstetrics and Gynecology

## 2021-07-10 ENCOUNTER — Encounter: Payer: Self-pay | Admitting: Obstetrics and Gynecology

## 2021-07-10 ENCOUNTER — Other Ambulatory Visit: Payer: Self-pay

## 2021-07-10 VITALS — BP 145/97 | HR 75 | Wt 169.5 lb

## 2021-07-10 DIAGNOSIS — O34219 Maternal care for unspecified type scar from previous cesarean delivery: Secondary | ICD-10-CM | POA: Diagnosis present

## 2021-07-10 DIAGNOSIS — Z3483 Encounter for supervision of other normal pregnancy, third trimester: Secondary | ICD-10-CM

## 2021-07-10 DIAGNOSIS — Z23 Encounter for immunization: Secondary | ICD-10-CM | POA: Diagnosis not present

## 2021-07-10 DIAGNOSIS — O99113 Other diseases of the blood and blood-forming organs and certain disorders involving the immune mechanism complicating pregnancy, third trimester: Secondary | ICD-10-CM | POA: Diagnosis present

## 2021-07-10 DIAGNOSIS — Z3A39 39 weeks gestation of pregnancy: Secondary | ICD-10-CM

## 2021-07-10 DIAGNOSIS — O358XX0 Maternal care for other (suspected) fetal abnormality and damage, not applicable or unspecified: Secondary | ICD-10-CM | POA: Diagnosis present

## 2021-07-10 DIAGNOSIS — O35EXX0 Maternal care for other (suspected) fetal abnormality and damage, fetal genitourinary anomalies, not applicable or unspecified: Secondary | ICD-10-CM | POA: Diagnosis present

## 2021-07-10 DIAGNOSIS — Z3490 Encounter for supervision of normal pregnancy, unspecified, unspecified trimester: Secondary | ICD-10-CM

## 2021-07-10 DIAGNOSIS — O99119 Other diseases of the blood and blood-forming organs and certain disorders involving the immune mechanism complicating pregnancy, unspecified trimester: Secondary | ICD-10-CM

## 2021-07-10 DIAGNOSIS — Z20822 Contact with and (suspected) exposure to covid-19: Secondary | ICD-10-CM | POA: Diagnosis present

## 2021-07-10 DIAGNOSIS — O9912 Other diseases of the blood and blood-forming organs and certain disorders involving the immune mechanism complicating childbirth: Principal | ICD-10-CM | POA: Diagnosis present

## 2021-07-10 DIAGNOSIS — D6959 Other secondary thrombocytopenia: Secondary | ICD-10-CM | POA: Diagnosis present

## 2021-07-10 DIAGNOSIS — D696 Thrombocytopenia, unspecified: Secondary | ICD-10-CM | POA: Diagnosis present

## 2021-07-10 LAB — RESP PANEL BY RT-PCR (FLU A&B, COVID) ARPGX2
Influenza A by PCR: NEGATIVE
Influenza B by PCR: NEGATIVE
SARS Coronavirus 2 by RT PCR: NEGATIVE

## 2021-07-10 LAB — POCT URINALYSIS DIPSTICK OB
Bilirubin, UA: NEGATIVE
Blood, UA: NEGATIVE
Glucose, UA: NEGATIVE
Ketones, UA: NEGATIVE
Leukocytes, UA: NEGATIVE
Nitrite, UA: NEGATIVE
POC,PROTEIN,UA: NEGATIVE
Spec Grav, UA: <=1.005 — AB (ref 1.010–1.025)
Urobilinogen, UA: 0.2 E.U./dL
pH, UA: 6.5 (ref 5.0–8.0)

## 2021-07-10 NOTE — Progress Notes (Signed)
ROB: Patient complains of loss of mucus plug Saturday. Has been haing contractions today every 6-8 minutes and scant dark red blood (thinks it is her bloody show). Desires membrane sweeping if cervix is favorable. Given flu vaccine today. Discussed labor precautions. Reviewed other pain management modalities as thrombocytopenia is significant, cannot receive epidural.

## 2021-07-10 NOTE — Progress Notes (Signed)
  OB-Pt present for routine prenatal care. Pt stated fetal movement present; regular contractions present; every 6-8 minutes, blood show and increase in discharge and lost mucus plug.      Flu vaccine administered and documented.

## 2021-07-10 NOTE — OB Triage Note (Signed)
Pt is a G2P1 presents with cxt less then 45min apart. Membrane sweep today in the office. Pt reports some bloody show  Pt report no LOF. Pt reports some nausea and diarrhea.

## 2021-07-11 ENCOUNTER — Encounter: Payer: Self-pay | Admitting: Obstetrics and Gynecology

## 2021-07-11 ENCOUNTER — Inpatient Hospital Stay: Payer: Medicaid Other | Admitting: Anesthesiology

## 2021-07-11 DIAGNOSIS — D696 Thrombocytopenia, unspecified: Secondary | ICD-10-CM

## 2021-07-11 DIAGNOSIS — O359XX0 Maternal care for (suspected) fetal abnormality and damage, unspecified, not applicable or unspecified: Secondary | ICD-10-CM | POA: Diagnosis not present

## 2021-07-11 DIAGNOSIS — O358XX0 Maternal care for other (suspected) fetal abnormality and damage, not applicable or unspecified: Secondary | ICD-10-CM | POA: Diagnosis present

## 2021-07-11 DIAGNOSIS — D6959 Other secondary thrombocytopenia: Secondary | ICD-10-CM | POA: Diagnosis present

## 2021-07-11 DIAGNOSIS — O26893 Other specified pregnancy related conditions, third trimester: Secondary | ICD-10-CM | POA: Diagnosis present

## 2021-07-11 DIAGNOSIS — Z3A39 39 weeks gestation of pregnancy: Secondary | ICD-10-CM

## 2021-07-11 DIAGNOSIS — O34219 Maternal care for unspecified type scar from previous cesarean delivery: Secondary | ICD-10-CM

## 2021-07-11 DIAGNOSIS — Z3490 Encounter for supervision of normal pregnancy, unspecified, unspecified trimester: Secondary | ICD-10-CM

## 2021-07-11 DIAGNOSIS — Z20822 Contact with and (suspected) exposure to covid-19: Secondary | ICD-10-CM | POA: Diagnosis present

## 2021-07-11 DIAGNOSIS — O99113 Other diseases of the blood and blood-forming organs and certain disorders involving the immune mechanism complicating pregnancy, third trimester: Secondary | ICD-10-CM | POA: Diagnosis not present

## 2021-07-11 DIAGNOSIS — O9912 Other diseases of the blood and blood-forming organs and certain disorders involving the immune mechanism complicating childbirth: Secondary | ICD-10-CM | POA: Diagnosis present

## 2021-07-11 LAB — PLATELET COUNT: Platelets: 100 10*3/uL — ABNORMAL LOW (ref 150–400)

## 2021-07-11 LAB — ABO/RH: ABO/RH(D): A POS

## 2021-07-11 LAB — TYPE AND SCREEN
ABO/RH(D): A POS
Antibody Screen: NEGATIVE

## 2021-07-11 LAB — CBC
HCT: 42 % (ref 36.0–46.0)
Hemoglobin: 14.2 g/dL (ref 12.0–15.0)
MCH: 29.8 pg (ref 26.0–34.0)
MCHC: 33.8 g/dL (ref 30.0–36.0)
MCV: 88.1 fL (ref 80.0–100.0)
Platelets: UNDETERMINED 10*3/uL (ref 150–400)
RBC: 4.77 MIL/uL (ref 3.87–5.11)
RDW: 15.7 % — ABNORMAL HIGH (ref 11.5–15.5)
WBC: 11 10*3/uL — ABNORMAL HIGH (ref 4.0–10.5)
nRBC: 0 % (ref 0.0–0.2)

## 2021-07-11 MED ORDER — PHENYLEPHRINE 40 MCG/ML (10ML) SYRINGE FOR IV PUSH (FOR BLOOD PRESSURE SUPPORT): 80.0000 ug | PREFILLED_SYRINGE | INTRAVENOUS | Status: DC | PRN

## 2021-07-11 MED ORDER — DIBUCAINE (PERIANAL) 1 % EX OINT: 1.0000 "application " | TOPICAL_OINTMENT | CUTANEOUS | Status: DC | PRN

## 2021-07-11 MED ORDER — LACTATED RINGERS IV SOLN
500.0000 mL | Freq: Once | INTRAVENOUS | Status: AC
  Administered 2021-07-11: 500 mL via INTRAVENOUS

## 2021-07-11 MED ORDER — OXYTOCIN-SODIUM CHLORIDE 30-0.9 UT/500ML-% IV SOLN
1.0000 m[IU]/min | INTRAVENOUS | Status: DC
  Administered 2021-07-11: 2 m[IU]/min via INTRAVENOUS

## 2021-07-11 MED ORDER — LIDOCAINE HCL (PF) 1 % IJ SOLN
INTRAMUSCULAR | Status: DC | PRN
  Administered 2021-07-11: 1 mL

## 2021-07-11 MED ORDER — ZOLPIDEM TARTRATE 5 MG PO TABS: 5.0000 mg | ORAL_TABLET | Freq: Every evening | ORAL | Status: DC | PRN

## 2021-07-11 MED ORDER — ONDANSETRON HCL 4 MG/2ML IJ SOLN: 4.0000 mg | INTRAMUSCULAR | Status: DC | PRN

## 2021-07-11 MED ORDER — LIDOCAINE HCL (PF) 1 % IJ SOLN
30.0000 mL | INTRAMUSCULAR | Status: DC | PRN
  Filled 2021-07-11: qty 30

## 2021-07-11 MED ORDER — AMMONIA AROMATIC IN INHA
RESPIRATORY_TRACT | Status: AC
  Filled 2021-07-11: qty 10

## 2021-07-11 MED ORDER — LACTATED RINGERS IV SOLN: INTRAVENOUS | Status: DC

## 2021-07-11 MED ORDER — ACETAMINOPHEN 325 MG PO TABS: 650.0000 mg | ORAL_TABLET | ORAL | Status: DC | PRN

## 2021-07-11 MED ORDER — IBUPROFEN 600 MG PO TABS
600.0000 mg | ORAL_TABLET | Freq: Four times a day (QID) | ORAL | Status: DC
  Administered 2021-07-11 – 2021-07-12 (×4): 600 mg via ORAL
  Filled 2021-07-11 (×4): qty 1

## 2021-07-11 MED ORDER — OXYCODONE-ACETAMINOPHEN 5-325 MG PO TABS: 1.0000 | ORAL_TABLET | ORAL | Status: DC | PRN

## 2021-07-11 MED ORDER — DIPHENHYDRAMINE HCL 50 MG/ML IJ SOLN: 50.0000 mg | Freq: Once | INTRAMUSCULAR | Status: DC

## 2021-07-11 MED ORDER — EPHEDRINE 5 MG/ML INJ: 10.0000 mg | INTRAVENOUS | Status: DC | PRN

## 2021-07-11 MED ORDER — BUTORPHANOL TARTRATE 1 MG/ML IJ SOLN
1.0000 mg | INTRAMUSCULAR | Status: DC | PRN
Start: 2021-07-11 — End: 2021-07-11
  Administered 2021-07-11: 1 mg via INTRAVENOUS
  Filled 2021-07-11: qty 1

## 2021-07-11 MED ORDER — TERBUTALINE SULFATE 1 MG/ML IJ SOLN: 0.2500 mg | Freq: Once | INTRAMUSCULAR | Status: DC | PRN

## 2021-07-11 MED ORDER — MISOPROSTOL 200 MCG PO TABS
ORAL_TABLET | ORAL | Status: AC
  Filled 2021-07-11: qty 4

## 2021-07-11 MED ORDER — OXYCODONE-ACETAMINOPHEN 5-325 MG PO TABS: 2.0000 | ORAL_TABLET | ORAL | Status: DC | PRN

## 2021-07-11 MED ORDER — OXYTOCIN-SODIUM CHLORIDE 30-0.9 UT/500ML-% IV SOLN
2.5000 [IU]/h | INTRAVENOUS | Status: DC
  Administered 2021-07-11: 2.5 [IU]/h via INTRAVENOUS
  Filled 2021-07-11: qty 500

## 2021-07-11 MED ORDER — WITCH HAZEL-GLYCERIN EX PADS
1.0000 "application " | MEDICATED_PAD | CUTANEOUS | Status: DC | PRN
  Administered 2021-07-11: 1 via TOPICAL
  Filled 2021-07-11 (×2): qty 100

## 2021-07-11 MED ORDER — ONDANSETRON HCL 4 MG/2ML IJ SOLN: 4.0000 mg | Freq: Four times a day (QID) | INTRAMUSCULAR | Status: DC | PRN

## 2021-07-11 MED ORDER — DIPHENHYDRAMINE HCL 25 MG PO CAPS: 25.0000 mg | ORAL_CAPSULE | Freq: Four times a day (QID) | ORAL | Status: DC | PRN

## 2021-07-11 MED ORDER — SOD CITRATE-CITRIC ACID 500-334 MG/5ML PO SOLN: 30.0000 mL | ORAL | Status: DC | PRN

## 2021-07-11 MED ORDER — BENZOCAINE-MENTHOL 20-0.5 % EX AERO
1.0000 "application " | INHALATION_SPRAY | CUTANEOUS | Status: DC | PRN
  Administered 2021-07-11: 1 via TOPICAL
  Filled 2021-07-11 (×2): qty 56

## 2021-07-11 MED ORDER — LIDOCAINE-EPINEPHRINE (PF) 1.5 %-1:200000 IJ SOLN
INTRAMUSCULAR | Status: DC | PRN
  Administered 2021-07-11: 3 mL via EPIDURAL

## 2021-07-11 MED ORDER — COCONUT OIL OIL
1.0000 "application " | TOPICAL_OIL | Status: DC | PRN
  Filled 2021-07-11 (×2): qty 120

## 2021-07-11 MED ORDER — SENNOSIDES-DOCUSATE SODIUM 8.6-50 MG PO TABS
2.0000 | ORAL_TABLET | Freq: Every day | ORAL | Status: DC
  Administered 2021-07-12: 2 via ORAL
  Filled 2021-07-11: qty 2

## 2021-07-11 MED ORDER — FENTANYL-BUPIVACAINE-NACL 0.5-0.125-0.9 MG/250ML-% EP SOLN
EPIDURAL | Status: DC | PRN
  Administered 2021-07-11: 12 mL/h via EPIDURAL

## 2021-07-11 MED ORDER — OXYTOCIN BOLUS FROM INFUSION
333.0000 mL | Freq: Once | INTRAVENOUS | Status: AC
  Administered 2021-07-11: 333 mL via INTRAVENOUS

## 2021-07-11 MED ORDER — FLEET ENEMA 7-19 GM/118ML RE ENEM: 1.0000 | ENEMA | RECTAL | Status: DC | PRN

## 2021-07-11 MED ORDER — PRENATAL MULTIVITAMIN CH
1.0000 | ORAL_TABLET | Freq: Every day | ORAL | Status: DC
  Administered 2021-07-11 – 2021-07-12 (×2): 1 via ORAL
  Filled 2021-07-11 (×2): qty 1

## 2021-07-11 MED ORDER — SENNOSIDES-DOCUSATE SODIUM 8.6-50 MG PO TABS
ORAL_TABLET | ORAL | Status: AC
  Administered 2021-07-11: 2 via ORAL
  Filled 2021-07-11: qty 2

## 2021-07-11 MED ORDER — FENTANYL-BUPIVACAINE-NACL 0.5-0.125-0.9 MG/250ML-% EP SOLN
12.0000 mL/h | EPIDURAL | Status: DC | PRN
Start: 2021-07-11 — End: 2021-07-11

## 2021-07-11 MED ORDER — ACETAMINOPHEN 325 MG PO TABS
650.0000 mg | ORAL_TABLET | ORAL | Status: DC | PRN
  Administered 2021-07-11 – 2021-07-12 (×5): 650 mg via ORAL
  Filled 2021-07-11 (×5): qty 2

## 2021-07-11 MED ORDER — DIPHENHYDRAMINE HCL 50 MG/ML IJ SOLN
INTRAMUSCULAR | Status: AC
  Filled 2021-07-11: qty 1

## 2021-07-11 MED ORDER — DIPHENHYDRAMINE HCL 50 MG/ML IJ SOLN
12.5000 mg | INTRAMUSCULAR | Status: DC | PRN
Start: 2021-07-11 — End: 2021-07-11

## 2021-07-11 MED ORDER — FENTANYL-BUPIVACAINE-NACL 0.5-0.125-0.9 MG/250ML-% EP SOLN
EPIDURAL | Status: AC
  Filled 2021-07-11: qty 250

## 2021-07-11 MED ORDER — ONDANSETRON HCL 4 MG PO TABS
4.0000 mg | ORAL_TABLET | ORAL | Status: DC | PRN
  Filled 2021-07-11: qty 1

## 2021-07-11 MED ORDER — SIMETHICONE 80 MG PO CHEW
80.0000 mg | CHEWABLE_TABLET | ORAL | Status: DC | PRN
  Administered 2021-07-11: 80 mg via ORAL
  Filled 2021-07-11: qty 1

## 2021-07-11 MED ORDER — SODIUM CHLORIDE 0.9 % IV SOLN
INTRAVENOUS | Status: DC | PRN
  Administered 2021-07-11 (×3): 5 mL via EPIDURAL

## 2021-07-11 MED ORDER — OXYTOCIN 10 UNIT/ML IJ SOLN
INTRAMUSCULAR | Status: AC
  Filled 2021-07-11: qty 1

## 2021-07-11 MED ORDER — LACTATED RINGERS IV SOLN: 500.0000 mL | INTRAVENOUS | Status: DC | PRN

## 2021-07-11 NOTE — Anesthesia Preprocedure Evaluation (Signed)
Anesthesia Evaluation  Patient identified by MRN, date of birth, ID band Patient awake    Reviewed: Allergy & Precautions, H&P , NPO status , Patient's Chart, lab work & pertinent test results  Airway Mallampati: III  TM Distance: >3 FB Neck ROM: full    Dental  (+) Chipped   Pulmonary neg pulmonary ROS,    Pulmonary exam normal        Cardiovascular Exercise Tolerance: Good negative cardio ROS Normal cardiovascular exam     Neuro/Psych    GI/Hepatic negative GI ROS, Neg liver ROS,   Endo/Other  negative endocrine ROS  Renal/GU negative Renal ROS  negative genitourinary   Musculoskeletal   Abdominal   Peds  Hematology  (+) Blood dyscrasia, , Benign gestational thrombocytopenia in third trimester    Anesthesia Other Findings Past Medical History: No date: No pertinent past medical history  Past Surgical History: No date: BREAST MASS EXCISION; Left No date: BREAST SURGERY No date: CESAREAN SECTION  BMI    Body Mass Index: 29.01 kg/m      Reproductive/Obstetrics (+) Pregnancy History of cesarean delivery                             Anesthesia Physical Anesthesia Plan  ASA: 3  Anesthesia Plan: Epidural   Post-op Pain Management:    Induction:   PONV Risk Score and Plan:   Airway Management Planned:   Additional Equipment:   Intra-op Plan:   Post-operative Plan:   Informed Consent: I have reviewed the patients History and Physical, chart, labs and discussed the procedure including the risks, benefits and alternatives for the proposed anesthesia with the patient or authorized representative who has indicated his/her understanding and acceptance.     Dental Advisory Given  Plan Discussed with: Anesthesiologist  Anesthesia Plan Comments:         Anesthesia Quick Evaluation

## 2021-07-11 NOTE — Progress Notes (Signed)
TOLAC protocol initiated, Wynetta Emery, MD from anesthesia aware. Dr. Amalia Hailey in department.

## 2021-07-11 NOTE — Anesthesia Procedure Notes (Signed)
Epidural Patient location during procedure: OB Start time: 07/11/2021 6:53 AM End time: 07/11/2021 7:13 AM  Staffing Anesthesiologist: Iran Ouch, MD Performed: anesthesiologist   Preanesthetic Checklist Completed: patient identified, IV checked, site marked, risks and benefits discussed, surgical consent, monitors and equipment checked, pre-op evaluation and timeout performed  Epidural Patient position: sitting Prep: ChloraPrep Patient monitoring: heart rate, continuous pulse ox and blood pressure Approach: midline Location: L3-L4 Injection technique: LOR saline  Needle:  Needle type: Tuohy  Needle gauge: 18 G Needle length: 9 cm Needle insertion depth: 5 cm Catheter type: closed end Catheter size: 20 Guage Catheter at skin depth: 10 cm Test dose: negative and 1.5% lidocaine with Epi 1:200 K  Assessment Events: blood not aspirated, injection not painful, no injection resistance and no paresthesia  Additional Notes Reason for block:procedure for pain

## 2021-07-11 NOTE — Progress Notes (Signed)
Contacted Anesthesia for epidural

## 2021-07-11 NOTE — Lactation Note (Signed)
This note was copied from a baby's chart. Lactation Consultation Note  Patient Name: Rebecca Mckay [id number] Date: 07/11/2021 Reason for consult: Initial assessment;Term Age:29 hours  Initial lactation visit. Mom is P2, SVD 4 hours ago. Mom desires exclusively breastfeeding with [name], and has BF history of 14 months with her first child (now 59-21 months old).   1 documented feed since delivery for 15 minutes. Mom attempted a second feed around 4:45pm without any success, baby now resting comfortably not displaying any hunger cues.  LC reviewed newborn basics: feeding behaviors and patterns, early cues, feeding on demand, hand expression, 8-12 attempts in first 24 hours. Encouraged time skin to skin, and rest when able.  Whiteboard updated with LC name/number, encouraged to call with questions or for assistance as needed.  Maternal Data Has patient been taught Hand Expression?: Yes Does the patient have breastfeeding experience prior to this delivery?: Yes How long did the patient breastfeed?: 14 months (baby now 20-21 months)  Feeding Mother's Current Feeding Choice: Breast Milk  LATCH Score                    Lactation Tools Discussed/Used    Interventions Interventions: Breast feeding basics reviewed;Education;Hand express  Discharge    Consult Status Consult Status: Follow-up Date: 07/12/21 Follow-up type: In-patient    Lavonia Drafts 07/11/2021, 5:10 PM

## 2021-07-11 NOTE — H&P (Signed)
History and Physical   HPI  Rebecca Mckay is a 30 y.o. G2P1001 at [redacted]w[redacted]d Estimated Date of Delivery: 07/13/21 who is being admitted for labor management.She desires TOLAC.  She is c/o regular contractions. H/O fetal hydronephrosis.    OB History  OB History  Gravida Para Term Preterm AB Living  2 1 1  0 0 1  SAB IAB Ectopic Multiple Live Births  0 0 0 0 1    # Outcome Date GA Lbr Len/2nd Weight Sex Delivery Anes PTL Lv  2 Current           1 Term 10/05/19   3289 g F CS-Unspec   LIV    PROBLEM LIST  Pregnancy complications or risks: Patient Active Problem List   Diagnosis Date Noted   Pregnancy 07/11/2021   Fetal hydronephrosis in pregnancy, antepartum condition 06/19/2021   Thrombocytopenia affecting pregnancy ([place]) 06/19/2021   Patient desires vaginal birth after cesarean section (VBAC) 05/22/2021   Benign gestational thrombocytopenia in third trimester ([place]) 05/22/2021   History of cesarean delivery, currently pregnant 04/17/2021    Prenatal labs and studies: ABO, Rh: --/--/A POS Performed at [facility], [address] [place], [place] [address]  [phone]/21 0304) Antibody: NEG (09/20 2233) Rubella: Immune (02/15 0000) RPR: Non Reactive (07/07 1100)  HBsAg: Negative (02/15 0000)  HIV: Non-reactive, non reactive (02/15 0000)  XBD:ZHGDJMEQ/-- (08/30 6834)   Past Medical History:  Diagnosis Date   No pertinent past medical history      Past Surgical History:  Procedure Laterality Date   BREAST MASS EXCISION Left    BREAST SURGERY     CESAREAN SECTION       Medications    Current Discharge Medication List     CONTINUE these medications which have NOT CHANGED   Details  EVENING PRIMROSE OIL PO Take by mouth.    Prenatal Vit-Fe Fumarate-FA (PRENATAL MULTIVITAMIN) TABS tablet Take 1 tablet by mouth daily at 12 noon.         Allergies  Patient has no known allergies.  Review of Systems  Pertinent items noted in HPI and  remainder of comprehensive ROS otherwise negative.  Physical Exam  BP (!) 143/92 (BP Location: Right Arm)   Pulse 80   Temp 98.9 F (37.2 C) (Oral)   Resp 18   Ht 5\' 4"  (1.626 m)   Wt 76.7 kg   LMP 10/06/2020   BMI 29.01 kg/m   Lungs:  CTA B Cardio: RRR without M/R/G Abd: Soft, gravid, NT Presentation: cephalic EXT: No C/C/ 1+ Edema DTRs: 2+ B CERVIX: Dilation: 8.5 Effacement (%): 80 Station: -2 Presentation: Vertex Exam by:: Cannon Kettle RN  See Prenatal records for more detailed PE.     FHR:  Variability: Good {> 6 bpm)  Toco: Uterine Contractions: q3-5 minutes  Test Results  Results for orders placed or performed during the hospital encounter of 07/10/21 (from the past 24 hour(s))  Resp Panel by RT-PCR (Flu A&B, Covid) Nasopharyngeal Swab     Status: None   Collection Time: 07/10/21 10:32 PM   Specimen: Nasopharyngeal Swab; Nasopharyngeal(NP) swabs in vial transport medium  Result Value Ref Range   SARS Coronavirus 2 by RT PCR NEGATIVE NEGATIVE   Influenza A by PCR NEGATIVE NEGATIVE   Influenza B by PCR NEGATIVE NEGATIVE  Type and screen [place]     Status: None   Collection Time: 07/10/21 10:33 PM  Result Value Ref Range  ABO/RH(D) A POS    Antibody Screen NEG    Sample Expiration      07/13/2021,2359 Performed at [facility], [address] [place], [place] [address]   CBC     Status: Abnormal   Collection Time: 07/11/21  2:16 AM  Result Value Ref Range   WBC 11.0 (H) 4.0 - 10.5 K/uL   RBC 4.77 3.87 - 5.11 MIL/uL   Hemoglobin 14.2 12.0 - 15.0 g/dL   HCT 42.0 36.0 - 46.0 %   MCV 88.1 80.0 - 100.0 fL   MCH 29.8 26.0 - 34.0 pg   MCHC 33.8 30.0 - 36.0 g/dL   RDW 15.7 (H) 11.5 - 15.5 %   Platelets PLATELET CLUMPS NOTED ON SMEAR, UNABLE TO ESTIMATE 150 - 400 K/uL   nRBC 0.0 0.0 - 0.2 %  ABO/Rh     Status: None (Preliminary result)   Collection Time: 07/11/21  2:16 AM  Result Value Ref Range   ABO/RH(D)  PENDING   ABO/Rh     Status: None   Collection Time: 07/11/21  3:04 AM  Result Value Ref Range   ABO/RH(D)      A POS Performed at [facility], [place], [place] [address]    Group B Strep negative  Assessment   G2P1001 at [redacted]w[redacted]d Estimated Date of Delivery: 07/13/21  The fetus is reassuring.   Patient Active Problem List   Diagnosis Date Noted   Pregnancy 07/11/2021   Fetal hydronephrosis in pregnancy, antepartum condition 06/19/2021   Thrombocytopenia affecting pregnancy ([place]) 06/19/2021   Patient desires vaginal birth after cesarean section (VBAC) 05/22/2021   Benign gestational thrombocytopenia in third trimester ([place]) 05/22/2021   History of cesarean delivery, currently pregnant 04/17/2021    Plan  1. Admit to L&D :   2. EFM: -- Category 1 3. Stadol if desired.   4. Admission labs  5. Expect vaginal delivery  Finis Bud, M.D. 07/11/2021 6:30 AM

## 2021-07-11 NOTE — Progress Notes (Signed)
Patient ID: Rebecca Mckay, female   DOB: Feb 04, 1991, 30 y.o.   MRN: [id number] Discussed with pt. - verbalized consent. AROM - clear fluid. 7-8cm/90%

## 2021-07-11 NOTE — Progress Notes (Signed)
Rebecca Mckay is stable after delivery.Pt's support person is at bedside. Pt bonding with infant and performing skin to skin after delivery. Epidural catheter removed by RN, tip intact, no bleeding noted at site. Pt is stable and ambulated to the bathroom, voided a sufficient amount, and tolerated activity well. Pt ambulated to the wheelchair and transferred to mother/baby unit RM 338  for couplet care. Report given to Carola Rhine

## 2021-07-12 LAB — CBC
HCT: 35.8 % — ABNORMAL LOW (ref 36.0–46.0)
Hemoglobin: 12.2 g/dL (ref 12.0–15.0)
MCH: 29.9 pg (ref 26.0–34.0)
MCHC: 34.1 g/dL (ref 30.0–36.0)
MCV: 87.7 fL (ref 80.0–100.0)
Platelets: 86 10*3/uL — ABNORMAL LOW (ref 150–400)
RBC: 4.08 MIL/uL (ref 3.87–5.11)
RDW: 16.1 % — ABNORMAL HIGH (ref 11.5–15.5)
WBC: 9.4 10*3/uL (ref 4.0–10.5)
nRBC: 0 % (ref 0.0–0.2)

## 2021-07-12 MED ORDER — IBUPROFEN 600 MG PO TABS: 600.0000 mg | ORAL_TABLET | Freq: Four times a day (QID) | ORAL | 1 refills | Status: DC | PRN

## 2021-07-12 NOTE — Lactation Note (Signed)
This note was copied from a baby's chart. Lactation Consultation Note  Patient Name: Rebecca Mckay [id number] Date: 07/12/2021 Reason for consult: Follow-up assessment;Term Age:29 hours  Lactation follow-up prior to anticipated discharge. Baby fed well overnight, voided and has pooped.  Mom is confident in identifying feeding cues, cluster feeding, and baby's need to just suckle. Mom did offer a pacifier overnight for a short period and was counseled on impact it can have on latching and establishment of breastfeeding.  Reviewed with mom anticipated breast changes, management of breast fullness and possible need to soften tissue prior to latching baby.   Outpatient information provided, encouraged to call with any questions/concerns or for ongoing BF support.  Maternal Data Has patient been taught Hand Expression?: Yes Does the patient have breastfeeding experience prior to this delivery?: Yes How long did the patient breastfeed?: 14 months  Feeding Mother's Current Feeding Choice: Breast Milk  LATCH Score                    Lactation Tools Discussed/Used    Interventions Interventions: Education;Pre-pump if needed  Discharge Discharge Education: Engorgement and breast care;Outpatient recommendation Pump: Manual;Personal [facility])  Consult Status Consult Status: Complete Date: 07/12/21 Follow-up type: Call as needed    Lavonia Drafts 07/12/2021, 10:19 AM

## 2021-07-12 NOTE — Discharge Summary (Signed)
Postpartum Discharge Summary     Patient Name: Rebecca Mckay DOB: [birth year] MRN: [id number]  Date of admission: 07/10/2021 Delivery date:07/11/2021  Delivering provider: [name]  Date of discharge: 07/12/2021  Admitting diagnosis: Pregnancy [Z34.90] Intrauterine pregnancy: [redacted]w[redacted]d    Secondary diagnosis:  Active Problems:   Patient desires vaginal birth after cesarean section (VBAC)   Benign gestational thrombocytopenia in third trimester ([place]   Fetal hydronephrosis in pregnancy, antepartum condition   Pregnancy   Normal labor  Additional problems: None    Discharge diagnosis: Term Pregnancy Delivered and Gestational thrombocytopenia                                               Post partum procedures: None Augmentation: Pitocin Complications: None  Hospital course: Onset of Labor With Vaginal Delivery      30y.o. yo [id number] admitted in Active Labor on 07/10/2021. Patient had an uncomplicated labor course as follows:  Membrane Rupture Time/Date: 6:38 AM ,07/11/2021   Delivery Method:VBAC, Spontaneous  Episiotomy: None  Lacerations:  2nd degree;Perineal  Patient had an uncomplicated postpartum course.  She is ambulating, tolerating a regular diet, passing flatus, and urinating well. Patient is discharged home in stable condition on 07/12/21.  Newborn Data: Birth date:07/11/2021  Birth time:12:12 PM  Gender:Female  Living status:Living  Apgars:8 ,9  Weight:3730 g   Magnesium Sulfate received: No BMZ received: No Rhophylac:No MMR:No T-DaP:Given prenatally Flu: Given prenatally Transfusion:No  Physical exam  Vitals:   07/11/21 2035 07/11/21 2313 07/12/21 0340 07/12/21 0838  BP: (!) 128/91 127/89 (!) 132/97 131/90  Pulse: 70 80 80 79  Resp: _0 Temp: 98.1 F (36.7 C) 97.9 F (36.6 C) 97.7 F (36.5 C) 98.3 F (36.8 C)  TempSrc: Oral Oral Oral Oral  SpO2: 100% 100% 100%   Weight:      Height:       General: alert, cooperative,  and no distress Lochia: appropriate Uterine Fundus: firm Incision: N/A DVT Evaluation: No evidence of DVT seen on physical exam. Negative Homan's sign. No cords or calf tenderness. No significant calf/ankle edema. Labs: Lab Results  Component Value Date   WBC 9.4 07/12/2021   HGB 12.2 07/12/2021   HCT 35.8 (L) 07/12/2021   MCV 87.7 07/12/2021   PLT 86 (L) 07/12/2021   No flowsheet data found. Edinburgh Score: Edinburgh Postnatal Depression Scale Screening Tool 07/11/2021  I have been able to laugh and see the funny side of things. 0  I have looked forward with enjoyment to things. 0  I have blamed myself unnecessarily when things went wrong. 1  I have been anxious or worried for no good reason. 0  I have felt scared or panicky for no good reason. 0  Things have been getting on top of me. 0  I have been so unhappy that I have had difficulty sleeping. 0  I have felt sad or miserable. 0  I have been so unhappy that I have been crying. 1  The thought of harming myself has occurred to me. 0  Edinburgh Postnatal Depression Scale Total 2      After visit meds:  Allergies as of 07/12/2021   No Known Allergies      Medication List     STOP taking these medications    EVENING PRIMROSE  OIL PO       TAKE these medications    ibuprofen 600 MG tablet Commonly known as: ADVIL Take 1 tablet (600 mg total) by mouth every 6 (six) hours as needed.   prenatal multivitamin Tabs tablet Take 1 tablet by mouth daily at 12 noon.         Discharge home in stable condition Infant Feeding: Breast Infant Disposition:home with mother Discharge instruction: per After Visit Summary and Postpartum booklet. Activity: Advance as tolerated. Pelvic rest for 6 weeks.  Diet: routine diet Anticipated Birth Control:  Lactational amenorrhea Postpartum Appointment:6 weeks Additional Postpartum F/U: Postpartum Depression checkup Future Appointments: Future Appointments  Date Time  Provider [place]  07/17/2021 10:30 AM Harlin Heys, MD [facility] None   Follow up Visit:  Follow-up Information     [name], MD Follow up.   Specialties: Obstetrics and Gynecology, Radiology Why: 2 week televisit (postpartum mood check)  6 week postpartum visit Contact information: [address] [place] [address] [phone]                     07/12/2021 [name], MD

## 2021-07-12 NOTE — Progress Notes (Signed)
Post Partum Day # 1, s/p SVD (VBAC)  Subjective: no complaints, up ad lib, voiding, and tolerating PO. Breastfeeding is going well.   Objective: Temp:  [97.7 F (36.5 C)-98.5 F (36.9 C)] 98.3 F (36.8 C) (09/22 0838) Pulse Rate:  [68-107] 79 (09/22 0838) Resp:  [17-20] 18 (09/22 0838) BP: (95-139)/(56-97) 131/90 (09/22 0838) SpO2:  [93 %-100 %] 100 % (09/22 0340)  Physical Exam:  General: alert, cooperative, and no distress  Lungs: clear to auscultation bilaterally Breasts: normal appearance, no masses or tenderness Heart: regular rate and rhythm, S1, S2 normal, no murmur, click, rub or gallop Abdomen: soft, non-tender; bowel sounds normal; no masses,  no organomegaly Pelvis: Lochia: appropriate, Uterine Fundus: firm Extremities: DVT Evaluation: No evidence of DVT seen on physical exam. Negative Homan's sign. No cords or calf tenderness. No significant calf/ankle edema.   Labs:  CBC Latest Ref Rng & Units 07/12/2021 07/11/2021 07/11/2021  WBC 4.0 - 10.5 K/uL 9.4 - 11.0(H)  Hemoglobin 12.0 - 15.0 g/dL 12.2 - 14.2  Hematocrit 36.0 - 46.0 % 35.8(L) - 42.0  Platelets 150 - 400 K/uL 86(L) 100(L) PLATELET CLUMPS NOTED ON SMEAR, UNABLE TO ESTIMATE     Assessment/Plan: Doing well postpartum Breastfeeding, Lactation consult as needed Contraception: Lactational amenorrhea Gestational thrombocytopenia, will f/u at 6 week postpartum visit. If no resolution, will refer to Hematology for further evaluation.  Pediatricians aware of severe right hydronephrosis of neonate. Plan for renal ultrasound prior to discharge. Discharge home today.     LOS: 1 day   [name], MD [facility] 07/12/2021 9:51 AM

## 2021-07-12 NOTE — Progress Notes (Signed)
Pt d/c home in the company of husband and baby. D/c instructions reviewed with pt. Pt verbalized understanding.

## 2021-07-12 NOTE — Anesthesia Postprocedure Evaluation (Signed)
Anesthesia Post Note  Patient: Rebecca Mckay  Procedure(s) Performed: AN AD HOC LABOR EPIDURAL  Patient location during evaluation: Mother Baby Anesthesia Type: Epidural Level of consciousness: oriented and awake and alert Pain management: pain level controlled Vital Signs Assessment: post-procedure vital signs reviewed and stable Respiratory status: spontaneous breathing and respiratory function stable Cardiovascular status: blood pressure returned to baseline and stable Postop Assessment: no headache, no backache, no apparent nausea or vomiting and able to ambulate Anesthetic complications: no   No notable events documented.   Last Vitals:  Vitals:   07/11/21 2313 07/12/21 0340  BP: 127/89 (!) 132/97  Pulse: 80 80  Resp: 20 18  Temp: 36.6 C 36.5 C  SpO2: 100% 100%    Last Pain:  Vitals:   07/12/21 0435  TempSrc:   PainSc: 3                  Alison Stalling

## 2021-07-13 ENCOUNTER — Inpatient Hospital Stay: Admit: 2021-07-13 | Payer: Self-pay

## 2021-07-16 ENCOUNTER — Other Ambulatory Visit: Payer: Self-pay

## 2021-07-16 ENCOUNTER — Ambulatory Visit (INDEPENDENT_AMBULATORY_CARE_PROVIDER_SITE_OTHER): Payer: Medicaid Other | Admitting: Obstetrics and Gynecology

## 2021-07-16 VITALS — BP 126/87 | HR 84 | Resp 16 | Ht 64.0 in | Wt 160.8 lb

## 2021-07-16 DIAGNOSIS — O165 Unspecified maternal hypertension, complicating the puerperium: Secondary | ICD-10-CM | POA: Diagnosis not present

## 2021-07-16 DIAGNOSIS — Z0130 Encounter for examination of blood pressure without abnormal findings: Secondary | ICD-10-CM

## 2021-07-16 NOTE — Progress Notes (Signed)
Rebecca Mckay presents for  BP check. She is 6 days PP. She has been monitoring her blood pressure and last night it was 145/80. She denies chest pain, sob, visual disturbances, palpitations, and dizziness. She has had a headache x 2 days, but it has resolved today. She is does not take any blood pressure medications.  BP today 126/87 with a heart rate of 84.   Follow up in 1 week for PPV.

## 2021-07-17 ENCOUNTER — Encounter: Payer: Medicaid Other | Admitting: Obstetrics and Gynecology

## 2021-07-25 ENCOUNTER — Encounter: Payer: Self-pay | Admitting: Obstetrics and Gynecology

## 2021-07-25 ENCOUNTER — Telehealth (INDEPENDENT_AMBULATORY_CARE_PROVIDER_SITE_OTHER): Payer: Medicaid Other | Admitting: Obstetrics and Gynecology

## 2021-07-25 ENCOUNTER — Telehealth: Payer: Self-pay

## 2021-07-25 VITALS — Ht 64.0 in | Wt 152.0 lb

## 2021-07-25 DIAGNOSIS — Z1332 Encounter for screening for maternal depression: Secondary | ICD-10-CM | POA: Diagnosis not present

## 2021-07-25 DIAGNOSIS — O34219 Maternal care for unspecified type scar from previous cesarean delivery: Secondary | ICD-10-CM

## 2021-07-25 NOTE — Progress Notes (Signed)
Virtual Visit via Telephone Note  I connected with Rebecca Mckay on 07/25/21 at  2:30 PM EDT by telephone and verified that I am speaking with the correct person using two identifiers.  Originally attempted to connect with video however technical difficulties occurred. Converted to audio visit.   Location: Patient: Home Provider: Office   I discussed the limitations, risks, security and privacy concerns of performing an evaluation and management service by telephone and the availability of in person appointments. I also discussed with the patient that there may be a patient responsible charge related to this service. The patient expressed understanding and agreed to proceed.   History of Present Illness: Rebecca Mckay is a 30 y.o. G52P2002 female who presents for 2 week postpartum visit and mood check.  She is s/p successful TOLAC. Delivery was at 39.[redacted] weeks gestation under epidural anesthesia. Pregnancy was complicated by gestational thrombocytopenia. She reports that overall she is doing well. Has had issues with thrush, recently she and her infant were treated by Pediatrician. Notes improvement.  Otherwise breastfeeding is going well. Denies mood symptoms. Bleeding is slowing down.       Observations/Objective: Height 5\' 4"  (1.626 m), weight 152 lb (68.9 kg), currently breastfeeding. Gen App: Patient sounds to be in no apparent distress Psych: normal mood and affect. Normal speech.   Edinburgh Postnatal Depression Scale Screening Tool 07/25/2021 07/11/2021 07/11/2021  I have been able to laugh and see the funny side of things. 0 0 (No Data)  I have looked forward with enjoyment to things. 0 0 -  I have blamed myself unnecessarily when things went wrong. 1 1 -  I have been anxious or worried for no good reason. 0 0 -  I have felt scared or panicky for no good reason. 0 0 -  Things have been getting on top of me. 0 0 -  I have been so unhappy that I have had difficulty sleeping. 0 0 -   I have felt sad or miserable. 0 0 -  I have been so unhappy that I have been crying. 0 1 -  The thought of harming myself has occurred to me. 0 0 -  Edinburgh Postnatal Depression Scale Total 1 2 -      Assessment and Plan: Screening for postpartum depression - screen negative.  Postpartum state, s/p VBAC - overall doing well.  3. Lactating mother - patient doing well with breastfeeding, s/p recent treatment for thrush.    Follow Up Instructions:   RTC in 4 weeks for final postpartum visit.   I discussed the assessment and treatment plan with the patient. The patient was provided an opportunity to ask questions and all were answered. The patient agreed with the plan and demonstrated an understanding of the instructions.   The patient was advised to call back or seek an in-person evaluation if the symptoms worsen or if the condition fails to improve as anticipated.  I provided 7 minutes of non-face-to-face time during this encounter.   Rebecca Maid, MD [facility]

## 2021-07-25 NOTE — Progress Notes (Signed)
Pt present for postpartum check up. Pt stated that she was doing well no problems.

## 2021-07-27 NOTE — Telephone Encounter (Signed)
Called pt for televisit.

## 2021-08-28 ENCOUNTER — Encounter: Payer: Medicaid Other | Admitting: Obstetrics and Gynecology

## 2021-09-12 ENCOUNTER — Encounter: Payer: Self-pay | Admitting: Obstetrics and Gynecology

## 2021-09-12 ENCOUNTER — Other Ambulatory Visit: Payer: Self-pay

## 2021-09-12 ENCOUNTER — Ambulatory Visit (INDEPENDENT_AMBULATORY_CARE_PROVIDER_SITE_OTHER): Payer: Medicaid Other | Admitting: Obstetrics and Gynecology

## 2021-09-12 NOTE — Patient Instructions (Signed)

## 2021-09-12 NOTE — Progress Notes (Signed)
   OBSTETRICS POSTPARTUM CLINIC PROGRESS NOTE  Subjective:     Rebecca Mckay is a 30 y.o. G23P2002 female who presents for a postpartum visit. She is 6 weeks postpartum following a spontaneous vaginal delivery. I have fully reviewed the prenatal and intrapartum course. The delivery was at 39.5 gestational weeks.  Pregnancy complicated by GHTN at term. Anesthesia: epidural. Postpartum course has been well. Baby's course has been well. Baby is feeding by breast. Bleeding: patient has not not resumed menses, with No LMP recorded.. Bowel function is normal. Bladder function is normal. Patient is not sexually active. Contraception method desired is  lactational amenorrhea . Postpartum depression screening: negative.  EDPS score is 1.    The following portions of the patient's history were reviewed and updated as appropriate: allergies, current medications, past family history, past medical history, past social history, past surgical history, and problem list.  Review of Systems Pertinent items noted in HPI and remainder of comprehensive ROS otherwise negative.   Objective:    BP 116/68   Ht 5\' 4"  (1.626 m)   Wt 152 lb 6.4 oz (69.1 kg)   Breastfeeding Yes   BMI 26.16 kg/m   General:  alert and no distress   Breasts:  inspection negative, no nipple discharge or bleeding, no masses or nodularity palpable  Lungs: clear to auscultation bilaterally  Heart:  regular rate and rhythm, S1, S2 normal, no murmur, click, rub or gallop  Abdomen: soft, non-tender; bowel sounds normal; no masses,  no organomegaly.     Vulva:  normal  Vagina: normal vagina, no discharge, exudate, lesion, or erythema  Cervix:  no cervical motion tenderness and no lesions  Corpus: normal size, contour, position, consistency, mobility, non-tender  Adnexa:  normal adnexa and no mass, fullness, tenderness  Rectal Exam: Not performed.         Labs:  Lab Results  Component Value Date   HGB 12.2 07/12/2021      Assessment:   1. Postpartum care following vaginal delivery   2. Lactating mother    Plan:   1. Contraception:  lactational amenorrhea 2. No evidence of postpartum depression.  3. Follow up in: 4-6 months for annual exam, or sooner as needed.    [name], MD [facility]

## 2022-01-15 NOTE — Progress Notes (Addendum)
? ? ?GYNECOLOGY ANNUAL PHYSICAL EXAM PROGRESS NOTE ? ?Subjective:  ? ? Rebecca Mckay is a 31 y.o. G10P2002 female who presents for an annual exam. The patient has no complaints today. The patient is sexually active. The patient participates in regular exercise: yes. Has the patient ever been transfused or tattooed?: yes. The patient reports that there is not domestic violence in her life.  ? ?Reports that she desires to initiate birth control. Thought that she had a pregnancy scare recently, had 1 positive test but several negative ones after that. Has been using lactational amenorrhea and condoms as contraception since the birth of her child 6 months ago.  ? ?Menstrual History: ?Menarche age: 84 ?Patient's last menstrual period was 10/06/2020. Lactational amenorrhea, followed by pregnancy, now currently breastfeeding   ? ? ?Gynecologic History:  ?Contraception: none ?History of STI's: denies ?Last Pap: 05/07/2018 Results were: normal.  Denies h/o abnormal pap smears. ? ? ?OB History  ?Gravida Para Term Preterm AB Living  ?'2 2 2 '$ 0 0 2  ?SAB IAB Ectopic Multiple Live Births  ?0 0 0 0 2  ?  ?# Outcome Date GA Lbr Len/2nd Weight Sex Delivery Anes PTL Lv  ?2 Term 07/11/21 [redacted]w[redacted]d/ 01:12 8 lb 3.6 oz (3.73 kg) F VBAC EPI  LIV  ?   Name: [name], [name] ?   Apgar1: 8  Apgar5: 9  ?1 Term 10/05/19   7 lb 4 oz (3.289 kg) F CS-Unspec   LIV  ? ? ?Past Medical History:  ?Diagnosis Date  ? No pertinent past medical history   ? ? ?Past Surgical History:  ?Procedure Laterality Date  ? BREAST MASS EXCISION Left   ? BREAST SURGERY    ? CESAREAN SECTION    ? ? ?Family History  ?Problem Relation Age of Onset  ? Healthy Mother   ? Healthy Father   ? Healthy Maternal Grandmother   ? Healthy Maternal Grandfather   ? Breast cancer Paternal Grandmother   ? ? ?Social History  ? ?Socioeconomic History  ? Marital status: Married  ?  Spouse name: Nate  ? Number of children: 1  ? Years of education: Not on file  ? Highest education level:  Not on file  ?Occupational History  ? Not on file  ?Tobacco Use  ? Smoking status: Never  ? Smokeless tobacco: Never  ?Vaping Use  ? Vaping Use: Never used  ?Substance and Sexual Activity  ? Alcohol use: Never  ? Drug use: Never  ? Sexual activity: Not Currently  ?  Birth control/protection: None  ?Other Topics Concern  ? Not on file  ?Social History Narrative  ? Not on file  ? ?Social Determinants of Health  ? ?Financial Resource Strain: Not on file  ?Food Insecurity: Not on file  ?Transportation Needs: Not on file  ?Physical Activity: Not on file  ?Stress: Not on file  ?Social Connections: Not on file  ?Intimate Partner Violence: Not on file  ? ? ?Current Outpatient Medications on File Prior to Visit  ?Medication Sig Dispense Refill  ? Prenatal Vit-Fe Fumarate-FA (PRENATAL MULTIVITAMIN) TABS tablet Take 1 tablet by mouth daily at 12 noon.    ? ?No current facility-administered medications on file prior to visit.  ? ? ?No Known Allergies ? ? ?Review of Systems ?Constitutional: negative for chills, fatigue, fevers and sweats ?Eyes: negative for irritation, redness and visual disturbance ?Ears, nose, mouth, throat, and face: negative for hearing loss, nasal congestion, snoring and tinnitus ?Respiratory:  negative for asthma, cough, sputum ?Cardiovascular: negative for chest pain, dyspnea, exertional chest pressure/discomfort, irregular heart beat, palpitations and syncope ?Gastrointestinal: negative for abdominal pain, change in bowel habits, nausea and vomiting ?Genitourinary: negative for abnormal menstrual periods, genital lesions, sexual problems and vaginal discharge, dysuria and urinary incontinence ?Integument/breast: negative for breast lump, breast tenderness and nipple discharge ?Hematologic/lymphatic: negative for bleeding and easy bruising ?Musculoskeletal:negative for back pain and muscle weakness ?Neurological: negative for dizziness, headaches, vertigo and weakness ?Endocrine: negative for diabetic  symptoms including polydipsia, polyuria and skin dryness ?Allergic/Immunologic: negative for hay fever and urticaria    ? ? ?Objective:  ?Blood pressure 125/75, pulse 81, resp. rate 16, height '5\' 4"'$  (1.626 m), weight 151 lb 9.6 oz (68.8 kg), last menstrual period 10/06/2020, currently breastfeeding. Body mass index is 26.02 kg/m?. ? ?General Appearance:    Alert, cooperative, no distress, appears stated age, overweight  ?Head:    Normocephalic, without obvious abnormality, atraumatic  ?Eyes:    PERRL, conjunctiva/corneas clear, EOM's intact, both eyes  ?Ears:    Normal external ear canals, both ears  ?Nose:   Nares normal, septum midline, mucosa normal, no drainage or sinus tenderness  ?Throat:   Lips, mucosa, and tongue normal; teeth and gums normal  ?Neck:   Supple, symmetrical, trachea midline, no adenopathy; thyroid: no enlargement/tenderness/nodules; no carotid bruit or JVD  ?Back:     Symmetric, no curvature, ROM normal, no CVA tenderness  ?Lungs:     Clear to auscultation bilaterally, respirations unlabored  ?Chest Wall:    No tenderness or deformity  ? Heart:    Regular rate and rhythm, S1 and S2 normal, no murmur, rub or gallop  ?Breast Exam:    No tenderness, masses, or nipple abnormality  ?Abdomen:     Soft, non-tender, bowel sounds active all four quadrants, no masses, no organomegaly.    ?Genitalia:    Pelvic:external genitalia normal, vagina without lesions, discharge, or tenderness, rectovaginal septum  normal. Cervix normal in appearance, no cervical motion tenderness, no adnexal masses or tenderness.  Uterus normal size, shape, mobile, regular contours, nontender.  ?Rectal:    Normal external sphincter.  No hemorrhoids appreciated. Internal exam not done.   ?Extremities:   Extremities normal, atraumatic, no cyanosis or edema  ?Pulses:   2+ and symmetric all extremities  ?Skin:   Skin color, texture, turgor normal, no rashes or lesions  ?Lymph nodes:   Cervical, supraclavicular, and axillary nodes  normal  ?Neurologic:   CNII-XII intact, normal strength, sensation and reflexes throughout  ? ? ?Labs:  ?Lab Results  ?Component Value Date  ? WBC 9.4 07/12/2021  ? HGB 12.2 07/12/2021  ? HCT 35.8 (L) 07/12/2021  ? MCV 87.7 07/12/2021  ? PLT 86 (L) 07/12/2021  ? ? ?No results found for: CREATININE, BUN, NA, K, CL, CO2 ? ?No results found for: ALT, AST, GGT, ALKPHOS, BILITOT ? ?No results found for: TSH ? ?Results for orders placed or performed in visit on 01/16/22  ?POCT urine pregnancy  ?Result Value Ref Range  ? Preg Test, Ur Negative Negative  ? ? ?Assessment:  ? ?1. Encounter for well woman exam with routine gynecological exam   ?2. Cervical cancer screening   ?3. Screening for diabetes mellitus   ?4. Encounter for initial prescription of contraceptives, unspecified contraceptive   ? ?  ?Plan:  ?Blood tests: CBC with diff, Comprehensive metabolic panel, and HYQM5H. ?Breast self exam technique reviewed and patient encouraged to perform self-exam monthly. ?Contraception:  lactational amenorrhea.  Would like to initiate OCPs but is still breastfeeding. Will prescribe Slynd  Given sample in the office. Advised on Sunday start, and use of backup method for first 2 weeks as we are unsure if ovulation is occurring without menses. ?Discussed healthy lifestyle modifications. ?Pap smear ordered. ?COVID vaccination status: declines ?Follow up in 1 year for annual exam ? ? ?[name], MD ?[facility]  ?

## 2022-01-15 NOTE — Patient Instructions (Addendum)
Preventive Care 30-30 Years Old, Female ?Preventive care refers to lifestyle choices and visits with your health care provider that can promote health and wellness. Preventive care visits are also called wellness exams. ?What can I expect for my preventive care visit? ?Counseling ?During your preventive care visit, your health care provider may ask about your: ?Medical history, including: ?Past medical problems. ?Family medical history. ?Pregnancy history. ?Current health, including: ?Menstrual cycle. ?Method of birth control. ?Emotional well-being. ?Home life and relationship well-being. ?Sexual activity and sexual health. ?Lifestyle, including: ?Alcohol, nicotine or tobacco, and drug use. ?Access to firearms. ?Diet, exercise, and sleep habits. ?Work and work Statistician. ?Sunscreen use. ?Safety issues such as seatbelt and bike helmet use. ?Physical exam ?Your health care provider may check your: ?Height and weight. These may be used to calculate your BMI (body mass index). BMI is a measurement that tells if you are at a healthy weight. ?Waist circumference. This measures the distance around your waistline. This measurement also tells if you are at a healthy weight and may help predict your risk of certain diseases, such as type 2 diabetes and high blood pressure. ?Heart rate and blood pressure. ?Body temperature. ?Skin for abnormal spots. ?What immunizations do I need? ?Vaccines are usually given at various ages, according to a schedule. Your health care provider will recommend vaccines for you based on your age, medical history, and lifestyle or other factors, such as travel or where you work. ?What tests do I need? ?Screening ?Your health care provider may recommend screening tests for certain conditions. This may include: ?Pelvic exam and Pap test. ?Lipid and cholesterol levels. ?Diabetes screening. This is done by checking your blood sugar (glucose) after you have not eaten for a while (fasting). ?Hepatitis B  test. ?Hepatitis C test. ?HIV (human immunodeficiency virus) test. ?STI (sexually transmitted infection) testing, if you are at risk. ?BRCA-related cancer screening. This may be done if you have a family history of breast, ovarian, tubal, or peritoneal cancers. ?Talk with your health care provider about your test results, treatment options, and if necessary, the need for more tests. ?Follow these instructions at home: ?Eating and drinking ? ?Eat a healthy diet that includes fresh fruits and vegetables, whole grains, lean protein, and low-fat dairy products. ?Take vitamin and mineral supplements as recommended by your health care provider. ?Do not drink alcohol if: ?Your health care provider tells you not to drink. ?You are pregnant, may be pregnant, or are planning to become pregnant. ?If you drink alcohol: ?Limit how much you have to 0-1 drink a day. ?Know how much alcohol is in your drink. In the [place] one drink equals one 12 oz bottle of beer (355 mL), one 5 oz glass of wine (148 mL), or one 1? oz glass of hard liquor (44 mL). ?Lifestyle ?Brush your teeth every morning and night with fluoride toothpaste. Floss one time each day. ?Exercise for at least 30 minutes 5 or more days each week. ?Do not use any products that contain nicotine or tobacco. These products include cigarettes, chewing tobacco, and vaping devices, such as e-cigarettes. If you need help quitting, ask your health care provider. ?Do not use drugs. ?If you are sexually active, practice safe sex. Use a condom or other form of protection to prevent STIs. ?If you do not wish to become pregnant, use a form of birth control. If you plan to become pregnant, see your health care provider for a prepregnancy visit. ?Find healthy ways to manage stress, such as: ?Meditation, yoga,  or listening to music. ?Journaling. ?Talking to a trusted person. ?Spending time with friends and family. ?Minimize exposure to UV radiation to reduce your risk of skin  cancer. ?Safety ?Always wear your seat belt while driving or riding in a vehicle. ?Do not drive: ?If you have been drinking alcohol. Do not ride with someone who has been drinking. ?If you have been using any mind-altering substances or drugs. ?While texting. ?When you are tired or distracted. ?Wear a helmet and other protective equipment during sports activities. ?If you have firearms in your house, make sure you follow all gun safety procedures. ?Seek help if you have been physically or sexually abused. ?What's next? ?Go to your health care provider once a year for an annual wellness visit. ?Ask your health care provider how often you should have your eyes and teeth checked. ?Stay up to date on all vaccines. ?This information is not intended to replace advice given to you by your health care provider. Make sure you discuss any questions you have with your health care provider. ?Document Revised: 04/04/2021 Document Reviewed: 04/04/2021 ?Elsevier Patient Education ? [place]. ? ?

## 2022-01-16 ENCOUNTER — Other Ambulatory Visit: Payer: Self-pay

## 2022-01-16 ENCOUNTER — Encounter: Payer: Self-pay | Admitting: Obstetrics and Gynecology

## 2022-01-16 ENCOUNTER — Other Ambulatory Visit (HOSPITAL_COMMUNITY)
Admission: RE | Admit: 2022-01-16 | Discharge: 2022-01-16 | Disposition: A | Discharge status: Home / Self Care | Payer: Medicaid Other | Source: Ambulatory Visit | Attending: Obstetrics and Gynecology | Admitting: Obstetrics and Gynecology

## 2022-01-16 ENCOUNTER — Ambulatory Visit (INDEPENDENT_AMBULATORY_CARE_PROVIDER_SITE_OTHER): Payer: Medicaid Other | Admitting: Obstetrics and Gynecology

## 2022-01-16 VITALS — BP 125/75 | HR 81 | Resp 16 | Ht 64.0 in | Wt 151.6 lb

## 2022-01-16 DIAGNOSIS — Z124 Encounter for screening for malignant neoplasm of cervix: Secondary | ICD-10-CM

## 2022-01-16 DIAGNOSIS — Z131 Encounter for screening for diabetes mellitus: Secondary | ICD-10-CM

## 2022-01-16 DIAGNOSIS — Z01419 Encounter for gynecological examination (general) (routine) without abnormal findings: Secondary | ICD-10-CM

## 2022-01-16 DIAGNOSIS — Z30019 Encounter for initial prescription of contraceptives, unspecified: Secondary | ICD-10-CM

## 2022-01-16 LAB — POCT URINE PREGNANCY: Preg Test, Ur: NEGATIVE

## 2022-01-17 LAB — COMPREHENSIVE METABOLIC PANEL
ALT: 15 IU/L (ref 0–32)
AST: 15 IU/L (ref 0–40)
Albumin/Globulin Ratio: 2 (ref 1.2–2.2)
Albumin: 4.9 g/dL (ref 3.9–5.0)
Alkaline Phosphatase: 63 IU/L (ref 44–121)
BUN/Creatinine Ratio: 18 (ref 9–23)
BUN: 15 mg/dL (ref 6–20)
Bilirubin Total: <0.2 mg/dL (ref 0.0–1.2)
CO2: 24 mmol/L (ref 20–29)
Calcium: 9.1 mg/dL (ref 8.7–10.2)
Chloride: 104 mmol/L (ref 96–106)
Creatinine, Ser: 0.84 mg/dL (ref 0.57–1.00)
Globulin, Total: 2.4 g/dL (ref 1.5–4.5)
Glucose: 79 mg/dL (ref 70–99)
Potassium: 4.4 mmol/L (ref 3.5–5.2)
Sodium: 143 mmol/L (ref 134–144)
Total Protein: 7.3 g/dL (ref 6.0–8.5)
eGFR: 96 mL/min/{1.73_m2} (ref >59)

## 2022-01-17 LAB — CBC
Hematocrit: 40.8 % (ref 34.0–46.6)
Hemoglobin: 12.8 g/dL (ref 11.1–15.9)
MCH: 27 pg (ref 26.6–33.0)
MCHC: 31.4 g/dL — ABNORMAL LOW (ref 31.5–35.7)
MCV: 86 fL (ref 79–97)
Platelets: 213 10*3/uL (ref 150–450)
RBC: 4.74 x10E6/uL (ref 3.77–5.28)
RDW: 12.5 % (ref 11.7–15.4)
WBC: 5.6 10*3/uL (ref 3.4–10.8)

## 2022-01-17 LAB — HEMOGLOBIN A1C
Est. average glucose Bld gHb Est-mCnc: 114 mg/dL
Hgb A1c MFr Bld: 5.6 % (ref 4.8–5.6)

## 2022-01-21 LAB — CYTOLOGY - PAP
Comment: NEGATIVE
Diagnosis: NEGATIVE
High risk HPV: NEGATIVE

## 2022-07-27 IMAGING — US US MFM OB DETAIL+14 WK
1 series · 13 of 28 positions shown · non-contrast
Comparison: none

[Series 1: us mfm ob detail+14 wk · 94 acquisitions, 13 frames shown]
[im 4/94]
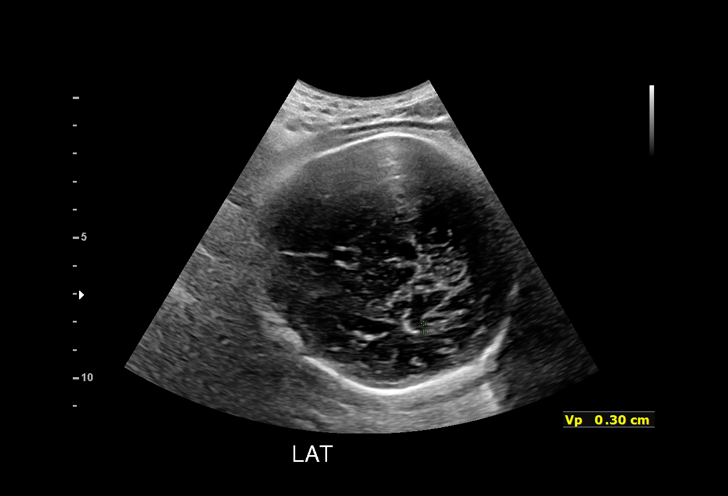
[im 11/94]
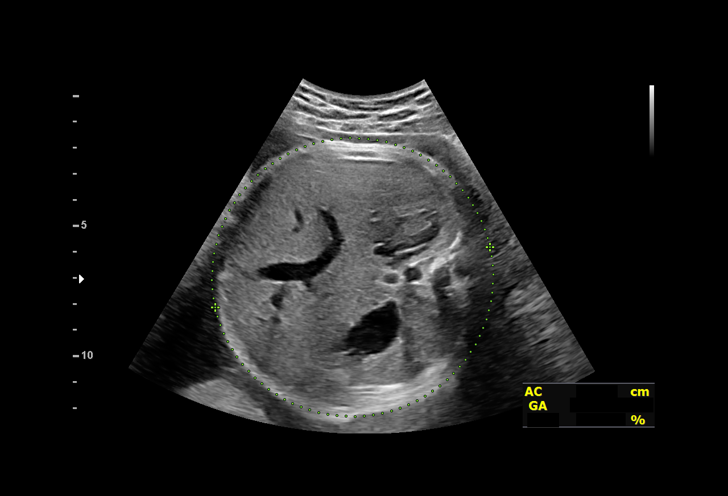
[im 18/94]
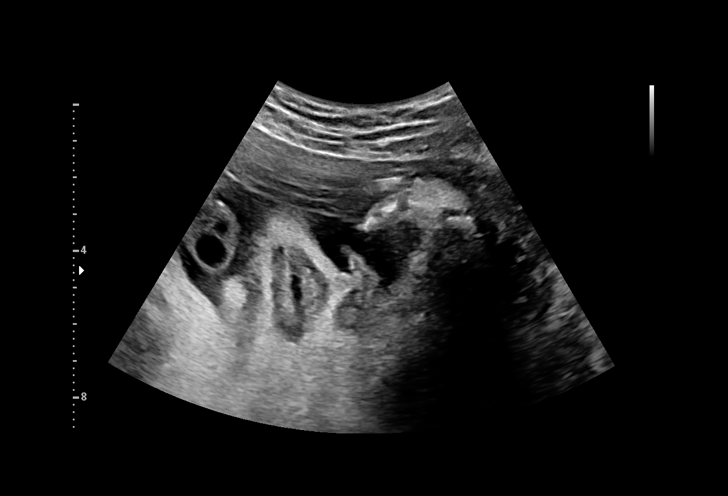
[im 25/94]
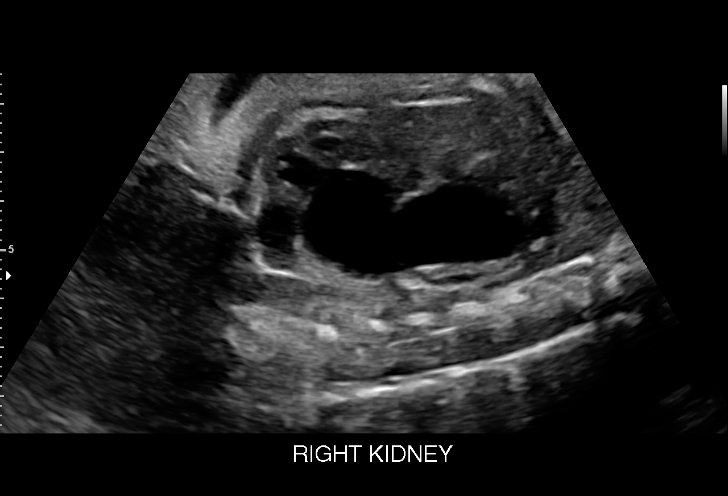
[im 32/94]
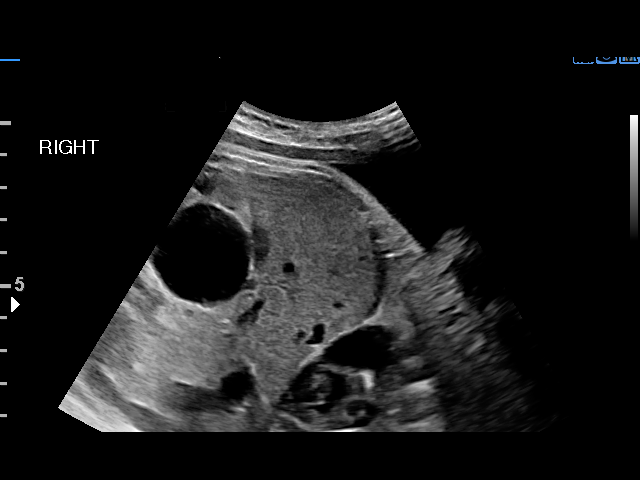
[im 38/94]
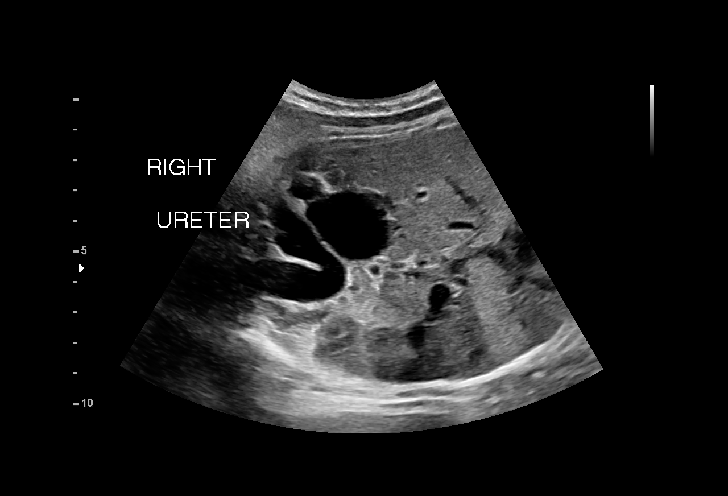
[im 49/94]
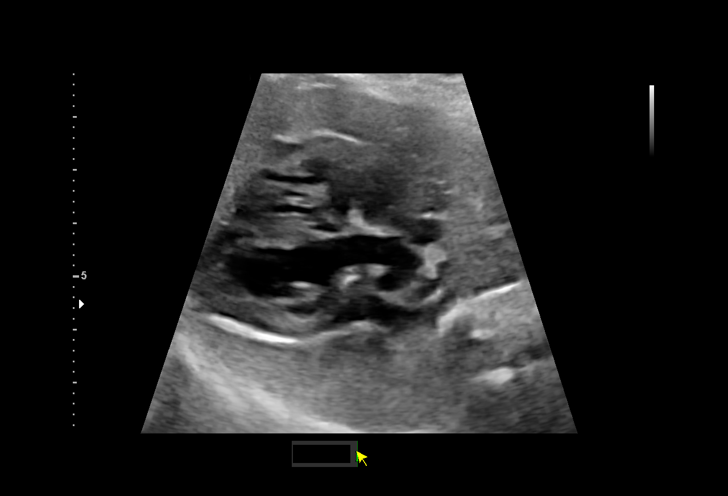
[im 56/94]
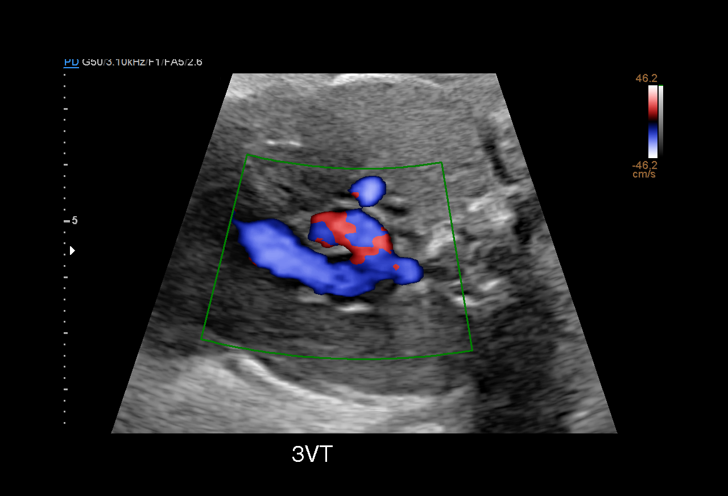
[im 63/94]
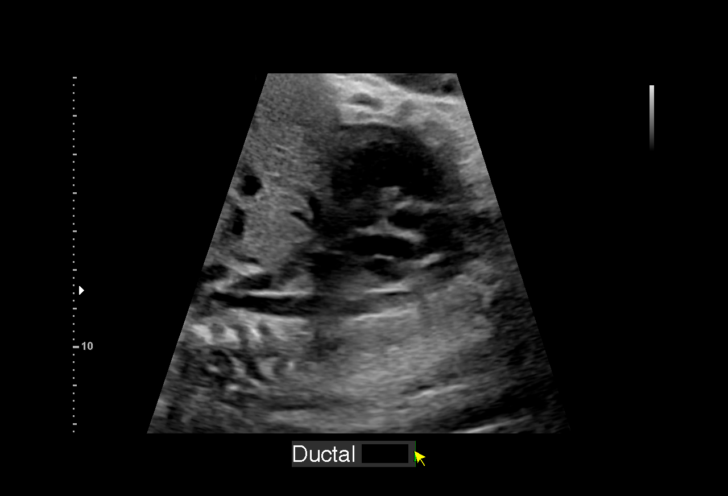
[im 69/94]
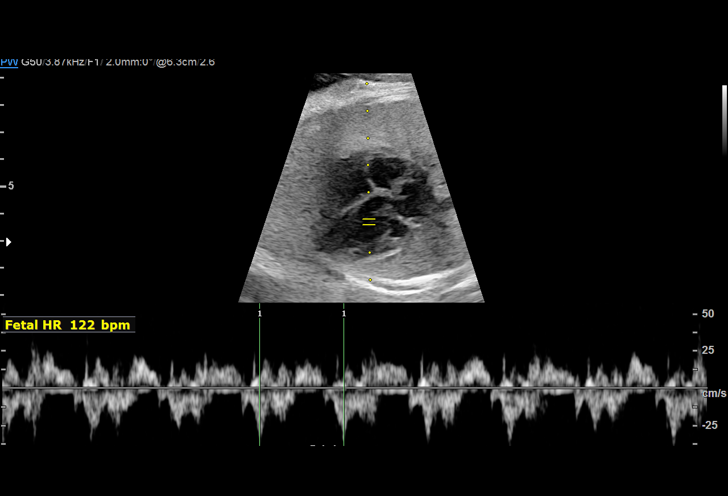
[im 76/94]
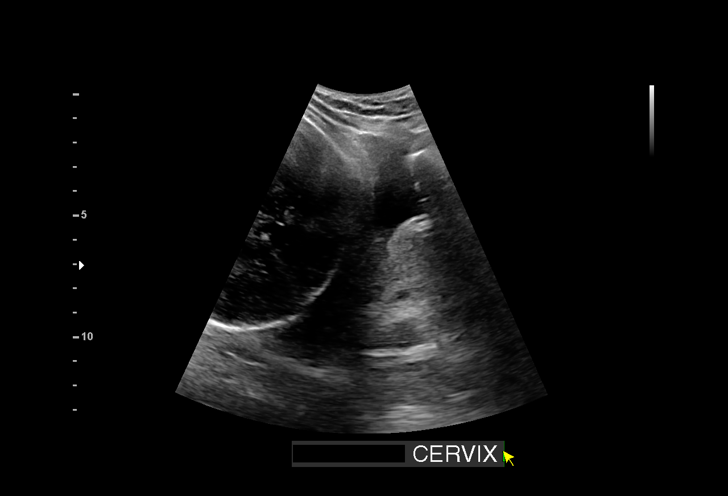
[im 83/94]
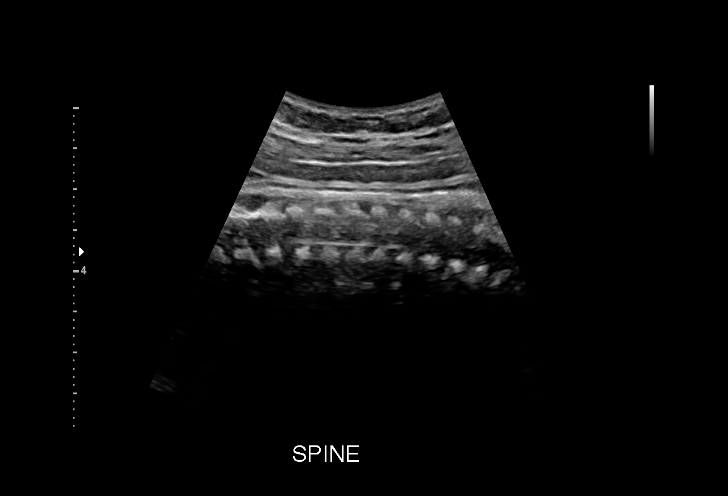
[im 90/94]
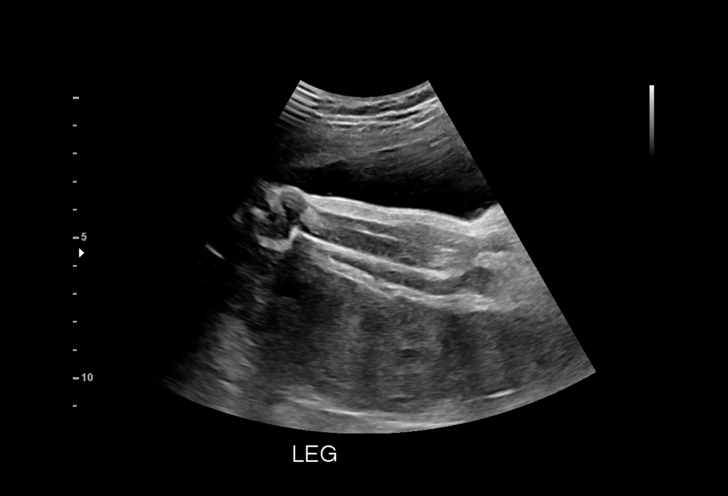

[13 of 28 positions shown; findings below may reference images not displayed]

[REDACTED]

 1  US MFM OB DETAIL +14 WK               76811.01    [facility]

Indications

 Fetal hydronephrosis during pregnancy,
 antepartum
 History of cesarean delivery, currently
 pregnant
 Encounter for antenatal screening for
 malformations
 34 weeks gestation of pregnancy
 Low risk NIPS
Fetal Evaluation

 Num Of Fetuses:          1
 Fetal Heart Rate(bpm):   122
 Cardiac Activity:        Observed
 Presentation:            Cephalic
 Placenta:                Posterior
 P. Cord Insertion:       Visualized, central

 Amniotic Fluid
 AFI FV:      Within normal limits

 AFI Sum(cm)     %Tile       Largest Pocket(cm)
 17              62

 RUQ(cm)       RLQ(cm)       LUQ(cm)        LLQ(cm)

Biometry

 BPD:      92.2  mm     G. Age:  37w 3d         98  %    CI:        82.04   %    70 - 86
                                                         FL/HC:       20.9  %    20.1 -
 HC:      321.2  mm     G. Age:  36w 2d         51  %    HC/AC:       0.95       0.93 -
 AC:        339  mm     G. Age:  37w 6d       > 99  %    FL/BPD:      72.9  %    71 - 87
 FL:       67.2  mm     G. Age:  34w 4d         34  %    FL/AC:       19.8  %    20 - 24
 HUM:      59.4  mm     G. Age:  34w 3d         56  %
 CER:      47.3  mm     G. Age:  35w 6d         54  %
 LV:          3  mm
 CM:          7  mm

 Est. FW:    6721   gm    6 lb 11 oz     93  %
OB History

 Gravidity:    2         Term:   1
 Living:       1
Gestational Age

 LMP:           34w 6d        Date:  10/06/20                 EDD:   07/13/21
 U/S Today:     36w 4d                                        EDD:   07/01/21
 Best:          34w 6d     Det. By:  LMP  (10/06/20)          EDD:   07/13/21
Anatomy

 Cranium:               Appears normal         Aortic Arch:            Not well visualized
 Cavum:                 Appears normal         Ductal Arch:            Appears normal
 Ventricles:            Appears normal         Diaphragm:              Appears normal
 Choroid Plexus:        Appears normal         Stomach:                Appears normal, left
                                                                       sided
 Cerebellum:            Appears normal         Abdomen:                Appears normal
 Posterior Fossa:       Appears normal         Abdominal Wall:         Not well visualized
 Nuchal Fold:           Not applicable (>20    Cord Vessels:           Appears normal (3
                        wks GA)                                        vessel cord)
 Face:                  Appears normal         Kidneys:                Abnormal- Right
                        (orbits and profile)
                                                                       hydronephrosis
 Lips:                  Appears normal         Bladder:                Appears normal
 Heart:                 Appears normal         Spine:                  Appears normal
                        (4CH, axis, and
                        situs)
 RVOT:                  Previously seen        Upper Extremities:      Not well visualized
 LVOT:                  Previously seen        Lower Extremities:      Not well visualized

 Other:  Fetus appears to be female.
Targeted Anatomy

 Thorax
 SVC:                   Appears normal         IVC:                    Appears normal
Impression

 Single intrauterine pregnancy here for a detailed anatomy
 due to fetal hydronephrosis
 Normal anatomy with measurements consistent with dates
 There is good fetal movement and amniotic fluid volume
 Suboptimal views of the fetal anatomy were obtained
 secondary to fetal position and advanced gestational age

 Right unilateral renal hydronephrosis with caliectasis is
 observed, [REDACTED] grade 3 is noted. Recommend pediatric
 urology evaluation prior to delivery or in the postnatal period.
Recommendations

 Follow up fetal kidney assessment in 2 weeks.

## 2022-09-06 ENCOUNTER — Ambulatory Visit: Payer: Medicaid Other | Admitting: Certified Nurse Midwife

## 2022-09-09 ENCOUNTER — Ambulatory Visit (INDEPENDENT_AMBULATORY_CARE_PROVIDER_SITE_OTHER): Payer: Medicaid Other

## 2022-09-09 DIAGNOSIS — Z3201 Encounter for pregnancy test, result positive: Secondary | ICD-10-CM | POA: Diagnosis not present

## 2022-09-09 DIAGNOSIS — N926 Irregular menstruation, unspecified: Secondary | ICD-10-CM

## 2022-09-09 DIAGNOSIS — D219 Benign neoplasm of connective and other soft tissue, unspecified: Secondary | ICD-10-CM

## 2022-09-09 LAB — POCT URINE PREGNANCY: Preg Test, Ur: POSITIVE — AB

## 2022-09-09 MED ORDER — LEUPROLIDE ACETATE 3.75 MG IM KIT: 3.7500 mg | PACK | Freq: Once | INTRAMUSCULAR | Status: DC

## 2022-09-09 NOTE — Progress Notes (Signed)
Subjective:    Rebecca Mckay is a 31 y.o. female who presents for evaluation of amenorrhea. She believes she could be pregnant. Pregnancy is desired.  Last period was normal.   Last LMP: 07/31/2022   Lab Review Urine HCG: positive     Plan:    Pregnancy Test:  Positive: EDC: 05/07/2023. Briefly discussed positive results and sent to check out for scheduling for New OB appointments.

## 2022-09-19 DIAGNOSIS — Z3480 Encounter for supervision of other normal pregnancy, unspecified trimester: Secondary | ICD-10-CM | POA: Insufficient documentation

## 2022-09-19 NOTE — Progress Notes (Signed)
New OB Intake  I connected with  Rebecca Mckay on 09/20/2022 at 10:15 AM EST by telephone and verified that I am speaking with the correct person using two identifiers. Nurse is located at [employer] and pt is located at grocery store, agrees to continue with NOB intake.  I explained I am completing New OB Intake today. We discussed her EDD of 05/07/23 that is based on LMP of 07/31/22. Pt is G5/P2022. I reviewed her allergies, medications, Medical/Surgical/OB history, and appropriate screenings. Based on history, this is a/an pregnancy uncomplicated .   Patient Active Problem List   Diagnosis Date Noted   Pregnancy 07/11/2021   Patient desires vaginal birth after cesarean section (VBAC) 05/22/2021    Concerns addressed today: None  Delivery Plans:  Plans to deliver at [facility].  Anatomy US Explained first scheduled [place] will be on 10/09/22 and Anatomy scan will be at 20 weeks.  Labs Discussed genetic screening with patient. Patient desires genetic testing to be drawn with new OB labs. Discussed possible labs to be drawn at new OB appointment.  COVID Vaccine Patient has not had COVID vaccine.   Social Determinants of Health Food Insecurity: denies food insecurity Transportation: Patient denies transportation needs. Childcare: Discussed no children allowed at ultrasound appointments.   First visit review I reviewed new OB appt with pt. I explained she will have bloodwork and pap smear/pelvic exam if indicated. Explained pt will be seen by Jeannie Fend, MD at first visit; encounter routed to appropriate provider.   Drenda Freeze, [facility] 09/20/2022  10:15 AM

## 2022-09-20 ENCOUNTER — Ambulatory Visit (INDEPENDENT_AMBULATORY_CARE_PROVIDER_SITE_OTHER): Payer: Medicaid Other

## 2022-09-20 VITALS — Wt 140.0 lb

## 2022-09-20 DIAGNOSIS — Z1379 Encounter for other screening for genetic and chromosomal anomalies: Secondary | ICD-10-CM

## 2022-09-20 DIAGNOSIS — Z3480 Encounter for supervision of other normal pregnancy, unspecified trimester: Secondary | ICD-10-CM

## 2022-09-20 DIAGNOSIS — Z3689 Encounter for other specified antenatal screening: Secondary | ICD-10-CM

## 2022-10-09 ENCOUNTER — Other Ambulatory Visit: Payer: Self-pay | Admitting: Obstetrics and Gynecology

## 2022-10-09 ENCOUNTER — Other Ambulatory Visit: Payer: Medicaid Other

## 2022-10-09 ENCOUNTER — Ambulatory Visit (INDEPENDENT_AMBULATORY_CARE_PROVIDER_SITE_OTHER): Payer: Medicaid Other

## 2022-10-09 DIAGNOSIS — Z1379 Encounter for other screening for genetic and chromosomal anomalies: Secondary | ICD-10-CM

## 2022-10-09 DIAGNOSIS — Z3480 Encounter for supervision of other normal pregnancy, unspecified trimester: Secondary | ICD-10-CM

## 2022-10-09 DIAGNOSIS — Z3687 Encounter for antenatal screening for uncertain dates: Secondary | ICD-10-CM | POA: Diagnosis not present

## 2022-10-09 DIAGNOSIS — Z3A10 10 weeks gestation of pregnancy: Secondary | ICD-10-CM | POA: Diagnosis not present

## 2022-10-10 LAB — CBC/D/PLT+RPR+RH+ABO+RUBIGG...
Antibody Screen: NEGATIVE
Basophils Absolute: 0 10*3/uL (ref 0.0–0.2)
Basos: 1 %
EOS (ABSOLUTE): 0.2 10*3/uL (ref 0.0–0.4)
Eos: 4 %
HCV Ab: NONREACTIVE
HIV Screen 4th Generation wRfx: NONREACTIVE
Hematocrit: 42.7 % (ref 34.0–46.6)
Hemoglobin: 13.5 g/dL (ref 11.1–15.9)
Hepatitis B Surface Ag: NEGATIVE
Immature Grans (Abs): 0 10*3/uL (ref 0.0–0.1)
Immature Granulocytes: 0 %
Lymphocytes Absolute: 1.5 10*3/uL (ref 0.7–3.1)
Lymphs: 29 %
MCH: 28.1 pg (ref 26.6–33.0)
MCHC: 31.6 g/dL (ref 31.5–35.7)
MCV: 89 fL (ref 79–97)
Monocytes Absolute: 0.2 10*3/uL (ref 0.1–0.9)
Monocytes: 5 %
Neutrophils Absolute: 3.1 10*3/uL (ref 1.4–7.0)
Neutrophils: 61 %
Platelets: 210 10*3/uL (ref 150–450)
RBC: 4.8 x10E6/uL (ref 3.77–5.28)
RDW: 13.4 % (ref 11.7–15.4)
RPR Ser Ql: NONREACTIVE
Rh Factor: POSITIVE
Rubella Antibodies, IGG: 6.3 index (ref >0.99)
Varicella zoster IgG: 2101 index (ref >165)
WBC: 5.1 10*3/uL (ref 3.4–10.8)

## 2022-10-10 LAB — URINALYSIS, ROUTINE W REFLEX MICROSCOPIC
Bilirubin, UA: NEGATIVE
Glucose, UA: NEGATIVE
Ketones, UA: NEGATIVE
Leukocytes,UA: NEGATIVE
Nitrite, UA: NEGATIVE
Protein,UA: NEGATIVE
RBC, UA: NEGATIVE
Specific Gravity, UA: 1.006 (ref 1.005–1.030)
Urobilinogen, Ur: 0.2 mg/dL (ref 0.2–1.0)
pH, UA: 7 (ref 5.0–7.5)

## 2022-10-10 LAB — HCV INTERPRETATION

## 2022-10-11 LAB — MONITOR DRUG PROFILE 14(MW)
Amphetamine Scrn, Ur: NEGATIVE ng/mL
BARBITURATE SCREEN URINE: NEGATIVE ng/mL
BENZODIAZEPINE SCREEN, URINE: NEGATIVE ng/mL
Buprenorphine, Urine: NEGATIVE ng/mL
CANNABINOIDS UR QL SCN: NEGATIVE ng/mL
Cocaine (Metab) Scrn, Ur: NEGATIVE ng/mL
Creatinine(Crt), U: 16.6 mg/dL — ABNORMAL LOW (ref 20.0–300.0)
Fentanyl, Urine: NEGATIVE pg/mL
Meperidine Screen, Urine: NEGATIVE ng/mL
Methadone Screen, Urine: NEGATIVE ng/mL
OXYCODONE+OXYMORPHONE UR QL SCN: NEGATIVE ng/mL
Opiate Scrn, Ur: NEGATIVE ng/mL
Ph of Urine: 6.4 (ref 4.5–8.9)
Phencyclidine Qn, Ur: NEGATIVE ng/mL
Propoxyphene Scrn, Ur: NEGATIVE ng/mL
SPECIFIC GRAVITY: 1.0034
Tramadol Screen, Urine: NEGATIVE ng/mL

## 2022-10-11 LAB — NICOTINE SCREEN, URINE: Cotinine Ql Scrn, Ur: NEGATIVE ng/mL

## 2022-10-11 LAB — CULTURE, OB URINE

## 2022-10-11 LAB — URINE CULTURE, OB REFLEX: Organism ID, Bacteria: NO GROWTH

## 2022-10-15 LAB — MATERNIT 21 PLUS CORE, BLOOD
Fetal Fraction: 8
Result (T21): NEGATIVE
Trisomy 13 (Patau syndrome): NEGATIVE
Trisomy 18 (Edwards syndrome): NEGATIVE
Trisomy 21 (Down syndrome): NEGATIVE

## 2022-10-16 ENCOUNTER — Encounter: Payer: Self-pay | Admitting: Obstetrics and Gynecology

## 2022-10-16 ENCOUNTER — Ambulatory Visit (INDEPENDENT_AMBULATORY_CARE_PROVIDER_SITE_OTHER): Payer: Medicaid Other | Admitting: Obstetrics and Gynecology

## 2022-10-16 VITALS — BP 118/72 | HR 96 | Wt 138.5 lb

## 2022-10-16 DIAGNOSIS — Z3A11 11 weeks gestation of pregnancy: Secondary | ICD-10-CM | POA: Diagnosis not present

## 2022-10-16 DIAGNOSIS — Z3480 Encounter for supervision of other normal pregnancy, unspecified trimester: Secondary | ICD-10-CM

## 2022-10-16 LAB — POCT URINALYSIS DIPSTICK OB
Bilirubin, UA: NEGATIVE
Blood, UA: NEGATIVE
Glucose, UA: NEGATIVE
Ketones, UA: NEGATIVE
Leukocytes, UA: NEGATIVE
Nitrite, UA: NEGATIVE
POC,PROTEIN,UA: NEGATIVE
Spec Grav, UA: 1.02 (ref 1.010–1.025)
Urobilinogen, UA: 0.2 E.U./dL
pH, UA: 6 (ref 5.0–8.0)

## 2022-10-16 NOTE — Progress Notes (Signed)
Patient presents today for New OB physical. She states dong well this pregnancy. She is up to date for her pap smear. Patient completed genetic testing, Materniti21 negative. Patient states no other questions or concerns at this time.

## 2022-10-16 NOTE — Progress Notes (Signed)
NOB   Denies problems.  Nausea vomiting resolved.  MaterniT 21 testing normal.  Patient desires a second VBAC if possible.  First VBAC baby weight 8 pounds 4 ounces  Physical examination General NAD, Conversant  HEENT Atraumatic; Op clear with mmm.  Normo-cephalic. Pupils reactive. Anicteric sclerae  Thyroid/Neck Smooth without nodularity or enlargement. Normal ROM.  Neck Supple.  Skin No rashes, lesions or ulceration. Normal palpated skin turgor. No nodularity.  Breasts: No masses or discharge.  Symmetric.  No axillary adenopathy.  Lungs: Clear to auscultation.No rales or wheezes. Normal Respiratory effort, no retractions.  Heart: NSR.  No murmurs or rubs appreciated. No periferal edema  Abdomen: Soft.  Non-tender.  No masses.  No HSM. No hernia  Extremities: Moves all appropriately.  Normal ROM for age. No lymphadenopathy.  Neuro: Oriented to PPT.  Normal mood. Normal affect.     Pelvic:   Vulva: Normal appearance.  No lesions.  Vagina: No lesions or abnormalities noted.  Support: Normal pelvic support.  Urethra No masses tenderness or scarring.  Meatus Normal size without lesions or prolapse.  Cervix: Normal appearance.  No lesions.  Anus: Normal exam.  No lesions.  Perineum: Normal exam.  No lesions.        Bimanual   Adnexae: No masses.  Non-tender to palpation.  Uterus: Enlarged. 12wks  Non-tender.  Mobile.  AV.  Adnexae: No masses.  Non-tender to palpation.  Cul-de-sac: Negative for abnormality.  Adnexae: No masses.  Non-tender to palpation.         Pelvimetry   Diagonal: Reached.  Spines: Average.  Sacrum: Concave.  Pubic Arch: Normal.

## 2022-10-21 NOTE — L&D Delivery Note (Signed)
Delivery Note   Rebecca Mckay is a 32 y.o. [id number] at [redacted]w[redacted]d Estimated Date of Delivery: 05/07/23  PRE-OPERATIVE DIAGNOSIS:  1) [redacted]w[redacted]d pregnancy.  2) Hx of previous Csx1, successful VBAC 3) Gestational thrombocytopenia  POST-OPERATIVE DIAGNOSIS:  1) [redacted]w[redacted]d pregnancy s/p VBAC, Spontaneous Same and 2) Meconium stained fluid 3) Laceration repair  Delivery Type: VBAC, Spontaneous   Delivery Anesthesia: None  Labor Complications:      ESTIMATED BLOOD LOSS: 0 ml    FINDINGS:   1) female infant, Apgar scores of 8   at 1 minute and 9   at 5 minutes and a birthweight of   ounces.     SPECIMENS:   PLACENTA:   Appearance: Intact   Removal: Spontaneous     Disposition:    CORD BLOOD: Collected/Not Indicated  DISPOSITION:  Infant left in stable condition in the delivery room, with L&D personnel and mother,  NARRATIVE SUMMARY: Labor course:  Rebecca Mckay is a [id number] at [redacted]w[redacted]d who presented to Labor & Delivery for labor management. Her initial cervical exam was 8/100/-1. Labor proceeded spontaneously and she was found to be completely dilated at approximately 1145. With excellent maternal pushing effort, she birthed in hands and knees position a viable femaile infant at 1259. There was not a nuchal cord. The shoulders were birthed without difficulty. The infant was placed skin-to-skin with mother after initial resuscitation at the bedside. The cord was doubly clamped and cut after the placenta was birthed. The placenta delivered spontaneously and was noted to be intact with a 3VC. A perineal and vaginal examination was performed. Episiotomy/Lacerations:  a possible rectal fistual was noted on rectal exam. Dr. Valentino Saxon was notified and asked to come in for evaluation. See note by Dr. Valentino Saxon for repair.  [name], CNM 05/09/2023 12:39 PM

## 2022-11-01 ENCOUNTER — Encounter: Payer: Self-pay | Admitting: Obstetrics and Gynecology

## 2022-11-10 ENCOUNTER — Encounter: Payer: Self-pay | Admitting: Obstetrics and Gynecology

## 2022-11-13 ENCOUNTER — Ambulatory Visit (INDEPENDENT_AMBULATORY_CARE_PROVIDER_SITE_OTHER): Payer: Medicaid Other | Admitting: Certified Nurse Midwife

## 2022-11-13 VITALS — BP 120/78 | HR 88 | Wt 138.2 lb

## 2022-11-13 DIAGNOSIS — Z3A15 15 weeks gestation of pregnancy: Secondary | ICD-10-CM

## 2022-11-13 DIAGNOSIS — Z3482 Encounter for supervision of other normal pregnancy, second trimester: Secondary | ICD-10-CM

## 2022-11-13 LAB — POCT URINALYSIS DIPSTICK OB
Bilirubin, UA: NEGATIVE
Blood, UA: NEGATIVE
Glucose, UA: NEGATIVE
Ketones, UA: NEGATIVE
Leukocytes, UA: NEGATIVE
Nitrite, UA: NEGATIVE
POC,PROTEIN,UA: NEGATIVE
Spec Grav, UA: 1.02 (ref 1.010–1.025)
Urobilinogen, UA: 0.2 E.U./dL
pH, UA: 6 (ref 5.0–8.0)

## 2022-11-13 NOTE — Progress Notes (Signed)
ROB doing well, has no concerns today. She is feeling some fluttering. Discussed u/s for anatomy next visit. She verbalizes and agrees to plan. Follow up 4-5 wks .   Philip Aspen, CNM

## 2022-11-13 NOTE — Patient Instructions (Signed)
Round Ligament Pain  The round ligaments are a pair of cord-like tissues that help support the uterus. They can become a source of pain during pregnancy as the ligaments soften and stretch as the baby grows. The pain usually begins in the second trimester (13-28 weeks) of pregnancy, and should only last for a few seconds when it occurs. However, the pain can come and go until the baby is delivered. The pain does not cause harm to the baby. Round ligament pain is usually a short, sharp, and pinching pain, but it can also be a dull, lingering, and aching pain. The pain is felt in the lower side of the abdomen or in the groin. It usually starts deep in the groin and moves up to the outside of the hip area. The pain may happen when you: Suddenly change position, such as quickly going from a sitting to standing position. Do physical activity. Cough or sneeze. Follow these instructions at home: Managing pain  When the pain starts, relax. Then, try any of these methods to help with the pain: Sit down. Flex your knees up to your abdomen. Lie on your side with one pillow under your abdomen and another pillow between your legs. Sit in a warm bath for 15-20 minutes or until the pain goes away. General instructions Watch your condition for any changes. Move slowly when you sit down or stand up. Stop or reduce your physical activities if they cause pain. Avoid long walks if they cause pain. Take over-the-counter and prescription medicines only as told by your health care provider. Keep all follow-up visits. This is important. Contact a health care provider if: Your pain does not go away with treatment. You feel pain in your back that you did not have before. Your medicine is not helping. You have a fever or chills. You have nausea or vomiting. You have diarrhea. You have pain when you urinate. Get help right away if: You have pain that is a rhythmic, cramping pain similar to labor pains. Labor  pains are usually 2 minutes apart, last for about 1 minute, and involve a bearing down feeling or pressure in your pelvis. You have vaginal bleeding. These symptoms may represent a serious problem that is an emergency. Do not wait to see if the symptoms will go away. Get medical help right away. Call your local emergency services (911 in the [place] Do not drive yourself to the hospital. Summary Round ligament pain is felt in the lower abdomen or groin. This pain usually begins in the second trimester (13-28 weeks) and should only last for a few seconds when it occurs. You may notice the pain when you suddenly change position, when you cough or sneeze, or during physical activity. Relaxing, flexing your knees to your abdomen, lying on one side, or taking a warm bath may help to get rid of the pain. Contact your health care provider if the pain does not go away. This information is not intended to replace advice given to you by your health care provider. Make sure you discuss any questions you have with your health care provider. Document Revised: 12/20/2020 Document Reviewed: 12/20/2020 Elsevier Patient Education  2023 [employer]  

## 2022-12-02 ENCOUNTER — Ambulatory Visit
Admission: RE | Admit: 2022-12-02 | Discharge: 2022-12-02 | Disposition: A | Discharge status: Home / Self Care | Payer: Medicaid Other | Source: Ambulatory Visit | Attending: Certified Nurse Midwife | Admitting: Certified Nurse Midwife

## 2022-12-02 DIAGNOSIS — Z3689 Encounter for other specified antenatal screening: Secondary | ICD-10-CM | POA: Diagnosis present

## 2022-12-02 DIAGNOSIS — Z3A15 15 weeks gestation of pregnancy: Secondary | ICD-10-CM

## 2022-12-18 ENCOUNTER — Encounter: Payer: Self-pay | Admitting: Certified Nurse Midwife

## 2022-12-18 ENCOUNTER — Ambulatory Visit (INDEPENDENT_AMBULATORY_CARE_PROVIDER_SITE_OTHER): Payer: Medicaid Other | Admitting: Certified Nurse Midwife

## 2022-12-18 VITALS — BP 121/73 | HR 90 | Wt 142.5 lb

## 2022-12-18 DIAGNOSIS — Z3480 Encounter for supervision of other normal pregnancy, unspecified trimester: Secondary | ICD-10-CM

## 2022-12-18 DIAGNOSIS — Z3A20 20 weeks gestation of pregnancy: Secondary | ICD-10-CM

## 2022-12-18 DIAGNOSIS — Z23 Encounter for immunization: Secondary | ICD-10-CM

## 2022-12-18 DIAGNOSIS — Z3482 Encounter for supervision of other normal pregnancy, second trimester: Secondary | ICD-10-CM

## 2022-12-18 NOTE — Progress Notes (Signed)
ROB doing well, feeling movement. No questions/concerns today. Discussed up coming visit . She verbalizes and agrees. Follow up 4 wks.   Philip Aspen, CNM

## 2022-12-18 NOTE — Patient Instructions (Signed)
Round Ligament Pain  The round ligaments are a pair of cord-like tissues that help support the uterus. They can become a source of pain during pregnancy as the ligaments soften and stretch as the baby grows. The pain usually begins in the second trimester (13-28 weeks) of pregnancy, and should only last for a few seconds when it occurs. However, the pain can come and go until the baby is delivered. The pain does not cause harm to the baby. Round ligament pain is usually a short, sharp, and pinching pain, but it can also be a dull, lingering, and aching pain. The pain is felt in the lower side of the abdomen or in the groin. It usually starts deep in the groin and moves up to the outside of the hip area. The pain may happen when you: Suddenly change position, such as quickly going from a sitting to standing position. Do physical activity. Cough or sneeze. Follow these instructions at home: Managing pain  When the pain starts, relax. Then, try any of these methods to help with the pain: Sit down. Flex your knees up to your abdomen. Lie on your side with one pillow under your abdomen and another pillow between your legs. Sit in a warm bath for 15-20 minutes or until the pain goes away. General instructions Watch your condition for any changes. Move slowly when you sit down or stand up. Stop or reduce your physical activities if they cause pain. Avoid long walks if they cause pain. Take over-the-counter and prescription medicines only as told by your health care provider. Keep all follow-up visits. This is important. Contact a health care provider if: Your pain does not go away with treatment. You feel pain in your back that you did not have before. Your medicine is not helping. You have a fever or chills. You have nausea or vomiting. You have diarrhea. You have pain when you urinate. Get help right away if: You have pain that is a rhythmic, cramping pain similar to labor pains. Labor  pains are usually 2 minutes apart, last for about 1 minute, and involve a bearing down feeling or pressure in your pelvis. You have vaginal bleeding. These symptoms may represent a serious problem that is an emergency. Do not wait to see if the symptoms will go away. Get medical help right away. Call your local emergency services (911 in the [place] Do not drive yourself to the hospital. Summary Round ligament pain is felt in the lower abdomen or groin. This pain usually begins in the second trimester (13-28 weeks) and should only last for a few seconds when it occurs. You may notice the pain when you suddenly change position, when you cough or sneeze, or during physical activity. Relaxing, flexing your knees to your abdomen, lying on one side, or taking a warm bath may help to get rid of the pain. Contact your health care provider if the pain does not go away. This information is not intended to replace advice given to you by your health care provider. Make sure you discuss any questions you have with your health care provider. Document Revised: 12/20/2020 Document Reviewed: 12/20/2020 Elsevier Patient Education  2023 [employer]  

## 2023-01-15 ENCOUNTER — Ambulatory Visit (INDEPENDENT_AMBULATORY_CARE_PROVIDER_SITE_OTHER): Payer: Medicaid Other | Admitting: Advanced Practice Midwife

## 2023-01-15 ENCOUNTER — Encounter: Payer: Self-pay | Admitting: Advanced Practice Midwife

## 2023-01-15 VITALS — BP 100/60 | Wt 146.0 lb

## 2023-01-15 DIAGNOSIS — Z131 Encounter for screening for diabetes mellitus: Secondary | ICD-10-CM

## 2023-01-15 DIAGNOSIS — Z130 Encounter for screening for diseases of the blood and blood-forming organs and certain disorders involving the immune mechanism: Secondary | ICD-10-CM

## 2023-01-15 DIAGNOSIS — Z113 Encounter for screening for infections with a predominantly sexual mode of transmission: Secondary | ICD-10-CM

## 2023-01-15 DIAGNOSIS — Z3A24 24 weeks gestation of pregnancy: Secondary | ICD-10-CM

## 2023-01-15 DIAGNOSIS — Z369 Encounter for antenatal screening, unspecified: Secondary | ICD-10-CM

## 2023-01-15 DIAGNOSIS — Z3482 Encounter for supervision of other normal pregnancy, second trimester: Secondary | ICD-10-CM

## 2023-01-15 NOTE — Progress Notes (Signed)
Routine Prenatal Care Visit  Subjective  Rebecca Mckay is a 32 y.o. [id number] at [redacted]w[redacted]d being seen today for ongoing prenatal care.  She is currently monitored for the following issues for this low-risk pregnancy and has Patient desires vaginal birth after cesarean section (VBAC) and Supervision of other normal pregnancy, antepartum on their problem list.  ----------------------------------------------------------------------------------- Patient reports  she is doing well and has no concerns .   Contractions: Not present. Vag. Bleeding: None.  Movement: Present. Leaking Fluid denies.  ----------------------------------------------------------------------------------- The following portions of the patient's history were reviewed and updated as appropriate: allergies, current medications, past family history, past medical history, past social history, past surgical history and problem list. Problem list updated.  Objective  Blood pressure 100/60, weight 146 lb (66.2 kg), last menstrual period 07/31/2022, currently breastfeeding. Pregravid weight 143 lb (64.9 kg) Total Weight Gain 3 lb (1.361 kg) Urinalysis: Urine Protein    Urine Glucose    Fetal Status: Fetal Heart Rate (bpm): 137 Fundal Height: 24 cm Movement: Present     General:  Alert, oriented and cooperative. Patient is in no acute distress.  Skin: Skin is warm and dry. No rash noted.   Cardiovascular: Normal heart rate noted  Respiratory: Normal respiratory effort, no problems with respiration noted  Abdomen: Soft, gravid, appropriate for gestational age. Pain/Pressure: Present     Pelvic:  Cervical exam deferred        Extremities: Normal range of motion.  Edema: None  Mental Status: Normal mood and affect. Normal behavior. Normal judgment and thought content.   Assessment   32 y.o. [id number] at [redacted]w[redacted]d by  05/07/2023, by Last Menstrual Period presenting for routine prenatal visit  Plan   FIFTH Problems (from 09/20/22 to present)      Problem Noted Resolved   Supervision of other normal pregnancy, antepartum 09/19/2022 by Drenda Freeze, CMA No   Overview Addendum 09/20/2022 10:45 AM by Drenda Freeze, [place] Staff Provider  Office Location   [facility] Dating  Not found.  Language  English Anatomy US    Flu Vaccine  Offer Genetic Screen  NIPS:   TDaP vaccine  Offer Hgb A1C or  GTT Early : Third trimester :   Covid Undecided   LAB RESULTS   Rhogam     Blood Type     Feeding Plan Breastfeed Antibody    Contraception Nexplanon Rubella    Circumcision Yes RPR     Pediatrician  [facility]     Support Person Nate HIV    Prenatal Classes Yes Varicella     GBS  (For PCN allergy, check sensitivities)   BTL Consent  Hep C     VBAC Consent  Pap Diagnosis  Date Value Ref Range Status  01/16/2022   Final   - Negative for intraepithelial lesion or malignancy (NILM)      Hgb Electro      CF      SMA                    Preterm labor symptoms and general obstetric precautions including but not limited to vaginal bleeding, contractions, leaking of fluid and fetal movement were reviewed in detail with the patient. Please refer to After Visit Summary for other counseling recommendations.   Return in about 4 weeks (around 02/12/2023) for 28 wk labs and rob.  Rod Can, CNM 01/15/2023 11:36 AM

## 2023-02-12 ENCOUNTER — Ambulatory Visit (INDEPENDENT_AMBULATORY_CARE_PROVIDER_SITE_OTHER): Payer: Medicaid Other | Admitting: Obstetrics

## 2023-02-12 ENCOUNTER — Encounter: Payer: Self-pay | Admitting: Obstetrics

## 2023-02-12 ENCOUNTER — Other Ambulatory Visit: Payer: Medicaid Other

## 2023-02-12 VITALS — BP 118/76 | HR 92 | Wt 153.0 lb

## 2023-02-12 DIAGNOSIS — Z113 Encounter for screening for infections with a predominantly sexual mode of transmission: Secondary | ICD-10-CM

## 2023-02-12 DIAGNOSIS — Z131 Encounter for screening for diabetes mellitus: Secondary | ICD-10-CM

## 2023-02-12 DIAGNOSIS — Z369 Encounter for antenatal screening, unspecified: Secondary | ICD-10-CM

## 2023-02-12 DIAGNOSIS — Z3A28 28 weeks gestation of pregnancy: Secondary | ICD-10-CM

## 2023-02-12 DIAGNOSIS — Z3483 Encounter for supervision of other normal pregnancy, third trimester: Secondary | ICD-10-CM

## 2023-02-12 DIAGNOSIS — Z3480 Encounter for supervision of other normal pregnancy, unspecified trimester: Secondary | ICD-10-CM

## 2023-02-12 DIAGNOSIS — Z3482 Encounter for supervision of other normal pregnancy, second trimester: Secondary | ICD-10-CM

## 2023-02-12 DIAGNOSIS — Z23 Encounter for immunization: Secondary | ICD-10-CM

## 2023-02-12 DIAGNOSIS — Z130 Encounter for screening for diseases of the blood and blood-forming organs and certain disorders involving the immune mechanism: Secondary | ICD-10-CM

## 2023-02-12 LAB — POCT URINALYSIS DIPSTICK OB
Bilirubin, UA: NEGATIVE
Blood, UA: NEGATIVE
Glucose, UA: NEGATIVE
Ketones, UA: NEGATIVE
Leukocytes, UA: NEGATIVE
Nitrite, UA: NEGATIVE
POC,PROTEIN,UA: NEGATIVE
Spec Grav, UA: 1.01 (ref 1.010–1.025)
Urobilinogen, UA: 0.2 E.U./dL
pH, UA: 6 (ref 5.0–8.0)

## 2023-02-12 NOTE — Progress Notes (Signed)
ROB at [redacted]w[redacted]d.  Active baby. Liliya denies ctx, LOF, and vaginal bleeding. She is staying active. She plants to BF. She plans an epidural in labor. Nexplanon for contraception. 1-hour glucose, CBC, RPR today. RTC in 2 weeks.  Glenetta Borg, CNM

## 2023-02-13 ENCOUNTER — Encounter: Payer: Self-pay | Admitting: Obstetrics

## 2023-02-13 LAB — 28 WEEK RH+PANEL
Basophils Absolute: 0 10*3/uL (ref 0.0–0.2)
Basos: 0 %
EOS (ABSOLUTE): 0.2 10*3/uL (ref 0.0–0.4)
Eos: 3 %
Gestational Diabetes Screen: 95 mg/dL (ref 70–139)
HIV Screen 4th Generation wRfx: NONREACTIVE
Hematocrit: 37.2 % (ref 34.0–46.6)
Hemoglobin: 12.2 g/dL (ref 11.1–15.9)
Immature Grans (Abs): 0.1 10*3/uL (ref 0.0–0.1)
Immature Granulocytes: 1 %
Lymphocytes Absolute: 1.6 10*3/uL (ref 0.7–3.1)
Lymphs: 17 %
MCH: 28.4 pg (ref 26.6–33.0)
MCHC: 32.8 g/dL (ref 31.5–35.7)
MCV: 87 fL (ref 79–97)
Monocytes Absolute: 0.5 10*3/uL (ref 0.1–0.9)
Monocytes: 6 %
Neutrophils Absolute: 6.7 10*3/uL (ref 1.4–7.0)
Neutrophils: 73 %
Platelets: 134 10*3/uL — ABNORMAL LOW (ref 150–450)
RBC: 4.3 x10E6/uL (ref 3.77–5.28)
RDW: 13.4 % (ref 11.7–15.4)
RPR Ser Ql: NONREACTIVE
WBC: 9.1 10*3/uL (ref 3.4–10.8)

## 2023-02-26 ENCOUNTER — Encounter: Payer: Medicaid Other | Admitting: Licensed Practical Nurse

## 2023-03-07 ENCOUNTER — Encounter: Payer: Self-pay | Admitting: Obstetrics

## 2023-03-07 ENCOUNTER — Ambulatory Visit (INDEPENDENT_AMBULATORY_CARE_PROVIDER_SITE_OTHER): Payer: Medicaid Other | Admitting: Obstetrics

## 2023-03-07 VITALS — BP 127/83 | HR 106 | Wt 156.3 lb

## 2023-03-07 DIAGNOSIS — Z3A31 31 weeks gestation of pregnancy: Secondary | ICD-10-CM

## 2023-03-07 DIAGNOSIS — Z3480 Encounter for supervision of other normal pregnancy, unspecified trimester: Secondary | ICD-10-CM

## 2023-03-07 DIAGNOSIS — Z3483 Encounter for supervision of other normal pregnancy, third trimester: Secondary | ICD-10-CM

## 2023-03-07 LAB — POCT URINALYSIS DIPSTICK OB
Bilirubin, UA: NEGATIVE
Blood, UA: NEGATIVE
Glucose, UA: NEGATIVE
Ketones, UA: NEGATIVE
Leukocytes, UA: NEGATIVE
Nitrite, UA: NEGATIVE
POC,PROTEIN,UA: NEGATIVE
Spec Grav, UA: 1.01 (ref 1.010–1.025)
Urobilinogen, UA: 0.2 E.U./dL
pH, UA: 7 (ref 5.0–8.0)

## 2023-03-07 NOTE — Progress Notes (Signed)
Routine Prenatal Care Visit  Subjective  Rebecca Mckay is a 32 y.o. [id number] at [redacted]w[redacted]d being seen today for ongoing prenatal care.  She is currently monitored for the following issues for this low-risk pregnancy and has Patient desires vaginal birth after cesarean section (VBAC) and Supervision of other normal pregnancy, antepartum on their problem list.  ----------------------------------------------------------------------------------- Patient reports no complaints.  Plans VBAC Contractions: Irritability. Vag. Bleeding: None.  Movement: Present. Leaking Fluid denies.  ----------------------------------------------------------------------------------- The following portions of the patient's history were reviewed and updated as appropriate: allergies, current medications, past family history, past medical history, past social history, past surgical history and problem list. Problem list updated.  Objective  Blood pressure 127/83, pulse (!) 106, weight 156 lb 4.8 oz (70.9 kg), last menstrual period 07/31/2022, currently breastfeeding. Pregravid weight 143 lb (64.9 kg) Total Weight Gain 13 lb 4.8 oz (6.033 kg) Urinalysis: Urine Protein    Urine Glucose    Fetal Status:     Movement: Present     General:  Alert, oriented and cooperative. Patient is in no acute distress.  Skin: Skin is warm and dry. No rash noted.   Cardiovascular: Normal heart rate noted  Respiratory: Normal respiratory effort, no problems with respiration noted  Abdomen: Soft, gravid, appropriate for gestational age. Pain/Pressure: Present     Pelvic:  Cervical exam deferred        Extremities: Normal range of motion.  Edema: None  Mental Status: Normal mood and affect. Normal behavior. Normal judgment and thought content.   Assessment   32 y.o. [id number] at [redacted]w[redacted]d by  05/07/2023, by Last Menstrual Period presenting for routine prenatal visit  Plan   FIFTH Problems (from 09/20/22 to present)     Problem Noted Resolved    Supervision of other normal pregnancy, antepartum 09/19/2022 by Donnetta Hail, CMA No   Overview Addendum 03/07/2023  1:58 PM by Mirna Mires, CNM     Clinical Staff Provider  Office Location  [facility] Dating  Not found.  Language  English Anatomy US    Flu Vaccine  Offer Genetic Screen  NIPS:   TDaP vaccine  Offer Hgb A1C or  GTT Early : Third trimester :   Covid Undecided   LAB RESULTS   Rhogam  A/Positive/-- (12/20 1029)  Blood Type A/Positive/-- (12/20 1029)   Feeding Plan Breastfeed Antibody Negative (12/20 1029)  Contraception Nexplanon Rubella 6.30 (12/20 1029)  Circumcision Yes RPR Non Reactive (04/24 1533)   Pediatrician  [facility] Negative (12/20 1029)   Support Person Nate HIV Non Reactive (04/24 1533)  Prenatal Classes Yes Varicella     GBS  (For PCN allergy, check sensitivities)   BTL Consent  Hep C Non Reactive (12/20 1029)   VBAC Consent  Pap Diagnosis  Date Value Ref Range Status  01/16/2022   Final   - Negative for intraepithelial lesion or malignancy (NILM)      Hgb Electro      CF      SMA                    Preterm labor symptoms and general obstetric precautions including but not limited to vaginal bleeding, contractions, leaking of fluid and fetal movement were reviewed in detail with the patient. Please refer to After Visit Summary for other counseling recommendations.   Return in about 2 weeks (around 03/21/2023) for return OB.  Mirna Mires, CNM  03/07/2023 2:01 PM

## 2023-03-21 ENCOUNTER — Ambulatory Visit (INDEPENDENT_AMBULATORY_CARE_PROVIDER_SITE_OTHER): Payer: Medicaid Other | Admitting: Certified Nurse Midwife

## 2023-03-21 ENCOUNTER — Encounter: Payer: Self-pay | Admitting: Certified Nurse Midwife

## 2023-03-21 VITALS — BP 120/80 | HR 98 | Wt 161.0 lb

## 2023-03-21 DIAGNOSIS — Z3480 Encounter for supervision of other normal pregnancy, unspecified trimester: Secondary | ICD-10-CM

## 2023-03-21 DIAGNOSIS — Z3A33 33 weeks gestation of pregnancy: Secondary | ICD-10-CM

## 2023-03-21 DIAGNOSIS — Z3483 Encounter for supervision of other normal pregnancy, third trimester: Secondary | ICD-10-CM

## 2023-03-21 DIAGNOSIS — Z98891 History of uterine scar from previous surgery: Secondary | ICD-10-CM

## 2023-03-21 DIAGNOSIS — O34219 Maternal care for unspecified type scar from previous cesarean delivery: Secondary | ICD-10-CM

## 2023-03-21 NOTE — Patient Instructions (Signed)
Third Trimester of Pregnancy  The third trimester of pregnancy is from week 28 through week 40. This is months 7 through 9. The third trimester is a time when the unborn baby (fetus) is growing rapidly. At the end of the ninth month, the fetus is about 20 inches long and weighs 6-10 pounds. Body changes during your third trimester During the third trimester, your body will continue to go through many changes. The changes vary and generally return to normal after your baby is born. Physical changes Your weight will continue to increase. You can expect to gain 25-35 pounds (11-16 kg) by the end of the pregnancy if you begin pregnancy at a normal weight. If you are underweight, you can expect to gain 28-40 lb (about 13-18 kg), and if you are overweight, you can expect to gain 15-25 lb (about 7-11 kg). You may begin to get stretch marks on your hips, abdomen, and breasts. Your breasts will continue to grow and may hurt. A yellow fluid (colostrum) may leak from your breasts. This is the first milk you are producing for your baby. You may have changes in your hair. These can include thickening of your hair, rapid growth, and changes in texture. Some people also have hair loss during or after pregnancy, or hair that feels dry or thin. Your belly button may stick out. You may notice more swelling in your hands, face, or ankles. Health changes You may have heartburn. You may have constipation. You may develop hemorrhoids. You may develop swollen, bulging veins (varicose veins) in your legs. You may have increased body aches in the pelvis, back, or thighs. This is due to weight gain and increased hormones that are relaxing your joints. You may have increased tingling or numbness in your hands, arms, and legs. The skin on your abdomen may also feel numb. You may feel short of breath because of your expanding uterus. Other changes You may urinate more often because the fetus is moving lower into your pelvis  and pressing on your bladder. You may have more problems sleeping. This may be caused by the size of your abdomen, an increased need to urinate, and an increase in your body's metabolism. You may notice the fetus "dropping," or moving lower in your abdomen (lightening). You may have increased vaginal discharge. You may notice that you have pain around your pelvic bone as your uterus distends. Follow these instructions at home: Medicines Follow your health care provider's instructions regarding medicine use. Specific medicines may be either safe or unsafe to take during pregnancy. Do not take any medicines unless approved by your health care provider. Take a prenatal vitamin that contains at least 600 micrograms (mcg) of folic acid. Eating and drinking Eat a healthy diet that includes fresh fruits and vegetables, whole grains, good sources of protein such as meat, eggs, or tofu, and low-fat dairy products. Avoid raw meat and unpasteurized juice, milk, and cheese. These carry germs that can harm you and your baby. Eat 4 or 5 small meals rather than 3 large meals a day. You may need to take these actions to prevent or treat constipation: Drink enough fluid to keep your urine pale yellow. Eat foods that are high in fiber, such as beans, whole grains, and fresh fruits and vegetables. Limit foods that are high in fat and processed sugars, such as fried or sweet foods. Activity Exercise only as directed by your health care provider. Most people can continue their usual exercise routine during pregnancy. Try  to exercise for 30 minutes at least 5 days a week. Stop exercising if you experience contractions in the uterus. Stop exercising if you develop pain or cramping in the lower abdomen or lower back. Avoid heavy lifting. Do not exercise if it is very hot or humid or if you are at a high altitude. If you choose to, you may continue to have sex unless your health care provider tells you not  to. Relieving pain and discomfort Take frequent breaks and rest with your legs raised (elevated) if you have leg cramps or low back pain. Take warm sitz baths to soothe any pain or discomfort caused by hemorrhoids. Use hemorrhoid cream if your health care provider approves. Wear a supportive bra to prevent discomfort from breast tenderness. If you develop varicose veins: Wear support hose as told by your health care provider. Elevate your feet for 15 minutes, 3-4 times a day. Limit salt in your diet. Safety Talk to your health care provider before traveling far distances. Do not use hot tubs, steam rooms, or saunas. Wear your seat belt at all times when driving or riding in a car. Talk with your health care provider if someone is verbally or physically abusive to you. Preparing for birth To prepare for the arrival of your baby: Take prenatal classes to understand, practice, and ask questions about labor and delivery. Visit the hospital and tour the maternity area. Purchase a rear-facing car seat and make sure you know how to install it in your car. Prepare the baby's room or sleeping area. Make sure to remove all pillows and stuffed animals from the baby's crib to prevent suffocation. General instructions Avoid cat litter boxes and soil used by cats. These carry germs that can cause birth defects in the baby. If you have a cat, ask someone to clean the litter box for you. Do not douche or use tampons. Do not use scented sanitary pads. Do not use any products that contain nicotine or tobacco, such as cigarettes, e-cigarettes, and chewing tobacco. If you need help quitting, ask your health care provider. Do not use any herbal remedies, illegal drugs, or medicines that were not prescribed to you. Chemicals in these products can harm your baby. Do not drink alcohol. You will have more frequent prenatal exams during the third trimester. During a routine prenatal visit, your health care provider  will do a physical exam, perform tests, and discuss your overall health. Keep all follow-up visits. This is important. Where to find more information American Pregnancy Association: americanpregnancy.org [employer]: https://www.todd-brady.net/ Office on [employer] Health: Mighty[employer].nz Contact a health care provider if you have: A fever. Mild pelvic cramps, pelvic pressure, or nagging pain in your abdominal area or lower back. Vomiting or diarrhea. Bad-smelling vaginal discharge or foul-smelling urine. Pain when you urinate. A headache that does not go away when you take medicine. Visual changes or see spots in front of your eyes. Get help right away if: Your water breaks. You have regular contractions less than 5 minutes apart. You have spotting or bleeding from your vagina. You have severe abdominal pain. You have difficulty breathing. You have chest pain. You have fainting spells. You have not felt your baby move for the time period told by your health care provider. You have new or increased pain, swelling, or redness in an arm or leg. Summary The third trimester of pregnancy is from week 28 through week 40 (months 7 through 9). You may have more problems  sleeping. This can be caused by the size of your abdomen, an increased need to urinate, and an increase in your body's metabolism. You will have more frequent prenatal exams during the third trimester. Keep all follow-up visits. This is important. This information is not intended to replace advice given to you by your health care provider. Make sure you discuss any questions you have with your health care provider. Document Revised: 03/15/2020 Document Reviewed: 01/20/2020 Elsevier Patient Education  2024 Elsevier Inc.  Vaginal Birth After Cesarean Delivery  Vaginal birth after cesarean delivery (VBAC) means giving birth vaginally after previously delivering a baby  through a cesarean section, or C-section. VBAC may be a safe option for you, depending on your health and other factors. Discuss VBAC with your health care provider early in your pregnancy, so you can understand the risks, benefits, and options. Having these discussions early will give you time to make your birth plan. What increases the chances for a successful VBAC? These factors increase your chances of a successful VBAC: You have had only one previous C-section. You had a low transverse incision for your C-section. You have had a successful vaginal birth. Your labor starts naturally on or before your due date. You and your baby have had a healthy pregnancy. Your baby is head-down. What happens when I arrive at the birth center or hospital? Once you are in labor and have been admitted into the hospital or birth center, your health care team may: Review your pregnancy history and go over any concerns you have. Talk with you about your birth plan and discuss pain control options. Check your blood pressure, breathing rate, and heart rate. Check your baby's heartbeat. Monitor your uterus for contractions. Check whether your bag of water (amniotic sac) has broken (ruptured). Insert an IV into one of your veins. You may get fluids and medicine through the IV. Monitoring and exams Your health care team may check your contractions (uterine monitoring) and your baby's heart rate (fetal monitoring). You may need to be monitored for long periods at a time (continuously) with a monitoring device. Your health care provider may also do frequent physical exams. These may include checking how and where your baby is positioned in your uterus and checking your cervix to determine whether it is opening up (dilating) or thinning out (effacing). What happens during labor and delivery? Normal labor and delivery are divided into the following three stages: Stage 1 This is the longest stage of labor. Throughout  this stage, you will feel contractions. Contractions are generally mild, infrequent, and irregular at first. They get stronger, more frequent (about every 2-3 minutes), and more regular as you move through this stage. The first stage ends when your cervix is completely dilated to 4 inches (10 cm) and effaced. Stage 2 This stage starts once your cervix is completely dilated and effaced and lasts until you deliver your baby. In this stage, you will feel the urge to push your baby out of your vagina. You may feel stretching and burning pain, especially when the widest part of your baby's head passes through the vaginal opening (crowning). Once your baby is delivered, the umbilical cord will be clamped and cut. Your baby will be placed on your bare chest and covered with a warm blanket. If you are planning to breastfeed, watch your baby for feeding cues, like rooting or sucking. Stage 3 This stage starts immediately after your baby is born and ends after you deliver the placenta. This stage  may take anywhere from 5 to 30 minutes. What can I expect after labor and delivery? After labor is over, you and your baby will be checked to make sure you are both healthy, and you will both be monitored until you are ready to go home. You may be encouraged to stay in the same room with your baby to promote early bonding and successful breastfeeding. Your uterus will be checked and massaged regularly (fundal massage). You may continue to receive fluids and medicines through an IV. You will have some soreness and pain in your abdomen, vagina, and the area of skin between your vaginal opening and your anus (perineum). If an incision was made near your vagina (episiotomy) or if you had some vaginal tearing during delivery, cold compresses may be used to reduce pain and swelling. Follow these instructions at home: Incision care If you have an episiotomy incision, follow instructions from your health care provider  about how to take care of your incision. Check your incision area every day for signs of infection. Check for: Redness, swelling, or pain. More fluid or blood. Warmth. Pus or a bad smell. If directed, put ice on the episiotomy area. To do this: Put ice in a plastic bag. Place a towel between your skin and the bag. Leave the ice on for 20 minutes, 2-3 times a day. Remove the ice if your skin turns bright red. This is very important. If you cannot feel pain, heat, or cold, you have a greater risk of damage to the area. Activity Return to your normal activities as told by your health care provider. Ask your health care provider what activities are safe for you. Avoid sitting for a long time without moving. Get up to take short walks every 1-2 hours. General instructions Take over-the-counter and prescription medicines only as told by your health care provider. Do not take baths, swim, or use a hot tub until your health care provider approves. Ask if you may take showers. You may only be allowed to take sponge baths. It is normal to have vaginal bleeding after delivery. Wear a sanitary pad for vaginal bleeding and discharge. Keep all follow-up visits. This is important. Where to find more information American Pregnancy Association: americanpregnancy.org [employer]: acog.org Summary Vaginal birth after cesarean delivery (VBAC) means giving birth vaginally after previously delivering a baby through a cesarean section, or C-section. Once you are in labor and have been admitted into the hospital or birth center, your health care team may review your pregnancy history and go over any concerns you have. Although most women are able to have a successful VBAC, sometimes it is necessary to have another C-section. Keep all follow-up visits. This is important. This information is not intended to replace advice given to you by your health care provider. Make sure  you discuss any questions you have with your health care provider. Document Revised: 10/05/2020 Document Reviewed: 10/05/2020 Elsevier Patient Education  2024 [employer].

## 2023-03-21 NOTE — Progress Notes (Signed)
   PRENATAL VISIT NOTE  Subjective:  Rebecca Mckay is a 32 y.o. [id number] at [redacted]w[redacted]d being seen today for ongoing prenatal care.  She is currently monitored for the following issues for this low-risk pregnancy and has Patient desires vaginal birth after cesarean section (VBAC) and Supervision of other normal pregnancy, antepartum on their problem list.  Patient reports  pelvic pain that began after chasing 32yo, remains achy .  Contractions: Irregular. Vag. Bleeding: None.  Movement: Present. Denies leaking of fluid.   The following portions of the patient's history were reviewed and updated as appropriate: allergies, current medications, past family history, past medical history, past social history, past surgical history and problem list.   Objective:   Vitals:   03/21/23 1503  BP: 120/80  Pulse: 98  Weight: 161 lb (73 kg)   Total weight gain: 18 lb (8.165 kg) Fetal Status: Fetal Heart Rate (bpm): 140 Fundal Height: 33 cm Movement: Present  Presentation: Vertex   General:  Alert, oriented and cooperative. Patient is in no acute distress.  Skin: Skin is warm and dry. No rash noted.   Cardiovascular: Normal heart rate noted  Respiratory: Normal respiratory effort, no problems with respiration noted  Abdomen: Soft, gravid, appropriate for gestational age.  Pain/Pressure: Present     Pelvic: Cervical exam deferred        Extremities: Normal range of motion.  Edema: None  Mental Status: Normal mood and affect. Normal behavior. Normal judgment and thought content.   Assessment and Plan:  Pregnancy: [id number] at [redacted]w[redacted]d 1. [redacted] weeks gestation of pregnancy  2. History of cesarean delivery, currently pregnant  3. History of VBAC  4. Encounter for supervision of other normal pregnancy in third trimester  5. Supervision of other normal pregnancy, antepartum   Preterm labor symptoms and general obstetric precautions including but not limited to vaginal bleeding, contractions, leaking of  fluid and fetal movement were reviewed in detail with the patient. Symphysis pubis pain comfort measures reviewed, offered PT or chiropractor referral, discussed use of SI belt. Please refer to After Visit Summary for other counseling recommendations.   Return in 2 weeks (on 04/04/2023) for ROB.  Future Appointments  Date Time Provider Department Center  04/07/2023  1:55 PM Free, Lindalou Hose, CNM [facility] None    Dominica Severin, CNM

## 2023-04-07 ENCOUNTER — Ambulatory Visit (INDEPENDENT_AMBULATORY_CARE_PROVIDER_SITE_OTHER): Payer: Medicaid Other

## 2023-04-07 ENCOUNTER — Other Ambulatory Visit (HOSPITAL_COMMUNITY)
Admission: RE | Admit: 2023-04-07 | Discharge: 2023-04-07 | Disposition: A | Discharge status: Home / Self Care | Payer: Medicaid Other | Source: Ambulatory Visit

## 2023-04-07 VITALS — BP 126/85 | HR 88 | Wt 159.7 lb

## 2023-04-07 DIAGNOSIS — Z3480 Encounter for supervision of other normal pregnancy, unspecified trimester: Secondary | ICD-10-CM

## 2023-04-07 DIAGNOSIS — Z3483 Encounter for supervision of other normal pregnancy, third trimester: Secondary | ICD-10-CM

## 2023-04-07 DIAGNOSIS — Z3A35 35 weeks gestation of pregnancy: Secondary | ICD-10-CM

## 2023-04-07 DIAGNOSIS — D696 Thrombocytopenia, unspecified: Secondary | ICD-10-CM

## 2023-04-07 DIAGNOSIS — O99113 Other diseases of the blood and blood-forming organs and certain disorders involving the immune mechanism complicating pregnancy, third trimester: Secondary | ICD-10-CM

## 2023-04-07 LAB — POCT URINALYSIS DIPSTICK
Bilirubin, UA: NEGATIVE
Blood, UA: NEGATIVE
Glucose, UA: NEGATIVE
Ketones, UA: NEGATIVE
Leukocytes, UA: NEGATIVE
Nitrite, UA: NEGATIVE
Protein, UA: NEGATIVE
Spec Grav, UA: 1.015 (ref 1.010–1.025)
Urobilinogen, UA: 0.2 E.U./dL
pH, UA: 6.5 (ref 5.0–8.0)

## 2023-04-07 NOTE — Assessment & Plan Note (Signed)
Provided with resources for encouraging timely labor.  Reviewed labor warning signs and expectations for birth. Instructed to call office or come to hospital with persistent headache, vision changes, regular contractions, leaking of fluid, decreased fetal movement or vaginal bleeding.

## 2023-04-07 NOTE — Progress Notes (Signed)
    Return Prenatal Note   Assessment/Plan   Plan  32 y.o. [id number] at [redacted]w[redacted]d presents for follow-up OB visit. Reviewed prenatal record including previous visit note.  Supervision of other normal pregnancy, antepartum Provided with resources for encouraging timely labor.  Reviewed labor warning signs and expectations for birth. Instructed to call office or come to hospital with persistent headache, vision changes, regular contractions, leaking of fluid, decreased fetal movement or vaginal bleeding.    Orders Placed This Encounter  Procedures   Culture, beta strep (group b only)   POCT Urinalysis Dipstick   Return in about 1 week (around 04/14/2023) for ROB.   Future Appointments  Date Time Provider Department Center  04/14/2023 10:35 AM Mirna Mires, CNM [facility] None    For next visit:  continue with routine prenatal care     Subjective   32 y.o. [id number] at [redacted]w[redacted]d presents for this follow-up prenatal visit.  Patient has no concerns, feeling some increased pressure. Patient reports: Movement: Present Contractions: Irritability  Objective   Flow sheet Vitals: Pulse Rate: 88 BP: 126/85 Fundal Height: 36 cm Fetal Heart Rate (bpm): 145 Presentation: Vertex Total weight gain: 16 lb 11.2 oz (7.575 kg)  General Appearance  No acute distress, well appearing, and well nourished Pulmonary   Normal work of breathing Neurologic   Alert and oriented to person, place, and time Psychiatric   Mood and affect within normal limits  Lindalou Hose Lillyrose Reitan, CNM  06/17/242:42 PM

## 2023-04-09 LAB — CERVICOVAGINAL ANCILLARY ONLY
Chlamydia: NEGATIVE
Comment: NEGATIVE
Comment: NORMAL
Neisseria Gonorrhea: NEGATIVE

## 2023-04-11 LAB — CULTURE, BETA STREP (GROUP B ONLY): Strep Gp B Culture: NEGATIVE

## 2023-04-14 ENCOUNTER — Telehealth: Payer: Self-pay | Admitting: Obstetrics

## 2023-04-14 ENCOUNTER — Encounter: Payer: Medicaid Other | Admitting: Obstetrics

## 2023-04-14 NOTE — Telephone Encounter (Signed)
I contacted the patient via phone. I left voicemail due to [facility] being out of the office. I rescheduled the patient to Friday, 6/28 at 2:35 pm with AT. I advised patient of the reschedule and advised the patient to call back to confirm.

## 2023-04-14 NOTE — Telephone Encounter (Signed)
The patient is aware via mychart, change to appointment.

## 2023-04-18 ENCOUNTER — Ambulatory Visit (INDEPENDENT_AMBULATORY_CARE_PROVIDER_SITE_OTHER): Payer: Medicaid Other | Admitting: Certified Nurse Midwife

## 2023-04-18 ENCOUNTER — Encounter: Payer: Self-pay | Admitting: Certified Nurse Midwife

## 2023-04-18 VITALS — BP 135/71 | HR 116 | Wt 161.8 lb

## 2023-04-18 DIAGNOSIS — Z3483 Encounter for supervision of other normal pregnancy, third trimester: Secondary | ICD-10-CM

## 2023-04-18 DIAGNOSIS — Z3A37 37 weeks gestation of pregnancy: Secondary | ICD-10-CM

## 2023-04-18 LAB — POCT URINALYSIS DIPSTICK
Bilirubin, UA: NEGATIVE
Blood, UA: NEGATIVE
Glucose, UA: NEGATIVE
Ketones, UA: NEGATIVE
Leukocytes, UA: NEGATIVE
Nitrite, UA: NEGATIVE
Protein, UA: NEGATIVE
Spec Grav, UA: 1.01 (ref 1.010–1.025)
Urobilinogen, UA: 0.2 E.U./dL
pH, UA: 6.5 (ref 5.0–8.0)

## 2023-04-18 NOTE — Patient Instructions (Signed)
.  Braxton Hicks Contractions  Contractions of the uterus can occur throughout pregnancy, but they are not always a sign that you are in labor. You may have practice contractions called Braxton Hicks contractions. These false labor contractions are sometimes confused with true labor. What are Braxton Hicks contractions? Braxton Hicks contractions are tightening movements that occur in the muscles of the uterus before labor. Unlike true labor contractions, these contractions do not result in opening (dilation) and thinning of the lowest part of the uterus (cervix). Toward the end of pregnancy (32-34 weeks), Braxton Hicks contractions can happen more often and may become stronger. These contractions are sometimes difficult to tell apart from true labor because they can be very uncomfortable. How to tell the difference between true labor and false labor True labor Contractions last 30-70 seconds. Contractions become very regular. Discomfort is usually felt in the top of the uterus, and it spreads to the lower abdomen and low back. Contractions do not go away with walking. Contractions usually become stronger and more frequent. The cervix dilates and gets thinner. False labor Contractions are usually shorter, weaker, and farther apart than true labor contractions. Contractions are usually irregular. Contractions are often felt in the front of the lower abdomen and in the groin. Contractions may go away when you walk around or change positions while lying down. The cervix usually does not dilate or become thin. Sometimes, the only way to tell if you are in true labor is for your health care provider to look for changes in your cervix. Your health care provider will do a physical exam and may monitor your contractions. If you are in true labor, your health care provider will send you home with instructions about when to return to the hospital. You may continue to have Braxton Hicks contractions until  you go into true labor. Follow these instructions at home:  Take over-the-counter and prescription medicines only as told by your health care provider. If Braxton Hicks contractions are making you uncomfortable: Change your position from lying down or resting to walking, or change from walking to resting. Sit and rest in a tub of warm water. Drink enough fluid to keep your urine pale yellow. Dehydration may cause these contractions. Do slow and deep breathing several times an hour. Keep all follow-up visits. This is important. Contact a health care provider if: You have a fever. You have continuous pain in your abdomen. Your contractions become stronger, more regular, and closer together. You pass blood-tinged mucus. Get help right away if: You have fluid leaking or gushing from your vagina. You have bright red blood coming from your vagina. Your baby is not moving inside you as much as it used to. Summary You may have practice contractions called Braxton Hicks contractions. These false labor contractions are sometimes confused with true labor. Braxton Hicks contractions are usually shorter, weaker, farther apart, and less regular than true labor contractions. True labor contractions usually become stronger, more regular, and more frequent. Manage discomfort from Braxton Hicks contractions by changing position, resting in a warm bath, practicing deep breathing, and drinking plenty of water. Keep all follow-up visits. Contact your health care provider if your contractions become stronger, more regular, and closer together. This information is not intended to replace advice given to you by your health care provider. Make sure you discuss any questions you have with your health care provider. Document Revised: 08/14/2020 Document Reviewed: 08/14/2020 Elsevier Patient Education  2024 Elsevier Inc.  

## 2023-04-18 NOTE — Progress Notes (Signed)
Body mass index is 27.77 kg/m.

## 2023-04-18 NOTE — Progress Notes (Signed)
ROB doing well, feeling good movement. Denies any concerns today. SVE per pt request  1/50/-2 . Follow up 1 wk for ROB.   Doreene Burke, CNM

## 2023-04-21 ENCOUNTER — Encounter: Payer: Self-pay | Admitting: Obstetrics and Gynecology

## 2023-04-22 ENCOUNTER — Encounter: Payer: Self-pay | Admitting: Obstetrics and Gynecology

## 2023-04-22 ENCOUNTER — Ambulatory Visit (INDEPENDENT_AMBULATORY_CARE_PROVIDER_SITE_OTHER): Payer: Medicaid Other | Admitting: Obstetrics and Gynecology

## 2023-04-22 VITALS — BP 119/78 | HR 101 | Wt 164.2 lb

## 2023-04-22 DIAGNOSIS — Z3483 Encounter for supervision of other normal pregnancy, third trimester: Secondary | ICD-10-CM

## 2023-04-22 DIAGNOSIS — Z3A37 37 weeks gestation of pregnancy: Secondary | ICD-10-CM

## 2023-04-22 LAB — POCT URINALYSIS DIPSTICK OB
Bilirubin, UA: NEGATIVE
Blood, UA: NEGATIVE
Ketones, UA: NEGATIVE
Leukocytes, UA: NEGATIVE
Nitrite, UA: NEGATIVE
POC,PROTEIN,UA: NEGATIVE
Spec Grav, UA: 1.015 (ref 1.010–1.025)
Urobilinogen, UA: 0.2 E.U./dL
pH, UA: 6.5 (ref 5.0–8.0)

## 2023-04-22 NOTE — Progress Notes (Signed)
ROB: Having irregular contractions.  Reports daily fetal movement.  Cervix unchanged from last week's exam.

## 2023-04-22 NOTE — Progress Notes (Signed)
ROB. Patient states daily fetal movement with constant pressure. 36 week cultures were negative. She does desire a cervical check today. No additional concerns.

## 2023-04-28 ENCOUNTER — Encounter: Payer: Medicaid Other | Admitting: Licensed Practical Nurse

## 2023-04-30 ENCOUNTER — Encounter: Payer: Self-pay | Admitting: Certified Nurse Midwife

## 2023-04-30 ENCOUNTER — Ambulatory Visit (INDEPENDENT_AMBULATORY_CARE_PROVIDER_SITE_OTHER): Payer: Medicaid Other | Admitting: Certified Nurse Midwife

## 2023-04-30 VITALS — BP 121/79 | HR 105 | Wt 163.8 lb

## 2023-04-30 DIAGNOSIS — Z3A39 39 weeks gestation of pregnancy: Secondary | ICD-10-CM

## 2023-04-30 DIAGNOSIS — Z3483 Encounter for supervision of other normal pregnancy, third trimester: Secondary | ICD-10-CM

## 2023-04-30 NOTE — Patient Instructions (Signed)
.  Braxton Hicks Contractions  Contractions of the uterus can occur throughout pregnancy, but they are not always a sign that you are in labor. You may have practice contractions called Braxton Hicks contractions. These false labor contractions are sometimes confused with true labor. What are Braxton Hicks contractions? Braxton Hicks contractions are tightening movements that occur in the muscles of the uterus before labor. Unlike true labor contractions, these contractions do not result in opening (dilation) and thinning of the lowest part of the uterus (cervix). Toward the end of pregnancy (32-34 weeks), Braxton Hicks contractions can happen more often and may become stronger. These contractions are sometimes difficult to tell apart from true labor because they can be very uncomfortable. How to tell the difference between true labor and false labor True labor Contractions last 30-70 seconds. Contractions become very regular. Discomfort is usually felt in the top of the uterus, and it spreads to the lower abdomen and low back. Contractions do not go away with walking. Contractions usually become stronger and more frequent. The cervix dilates and gets thinner. False labor Contractions are usually shorter, weaker, and farther apart than true labor contractions. Contractions are usually irregular. Contractions are often felt in the front of the lower abdomen and in the groin. Contractions may go away when you walk around or change positions while lying down. The cervix usually does not dilate or become thin. Sometimes, the only way to tell if you are in true labor is for your health care provider to look for changes in your cervix. Your health care provider will do a physical exam and may monitor your contractions. If you are in true labor, your health care provider will send you home with instructions about when to return to the hospital. You may continue to have Braxton Hicks contractions until  you go into true labor. Follow these instructions at home:  Take over-the-counter and prescription medicines only as told by your health care provider. If Braxton Hicks contractions are making you uncomfortable: Change your position from lying down or resting to walking, or change from walking to resting. Sit and rest in a tub of warm water. Drink enough fluid to keep your urine pale yellow. Dehydration may cause these contractions. Do slow and deep breathing several times an hour. Keep all follow-up visits. This is important. Contact a health care provider if: You have a fever. You have continuous pain in your abdomen. Your contractions become stronger, more regular, and closer together. You pass blood-tinged mucus. Get help right away if: You have fluid leaking or gushing from your vagina. You have bright red blood coming from your vagina. Your baby is not moving inside you as much as it used to. Summary You may have practice contractions called Braxton Hicks contractions. These false labor contractions are sometimes confused with true labor. Braxton Hicks contractions are usually shorter, weaker, farther apart, and less regular than true labor contractions. True labor contractions usually become stronger, more regular, and more frequent. Manage discomfort from Braxton Hicks contractions by changing position, resting in a warm bath, practicing deep breathing, and drinking plenty of water. Keep all follow-up visits. Contact your health care provider if your contractions become stronger, more regular, and closer together. This information is not intended to replace advice given to you by your health care provider. Make sure you discuss any questions you have with your health care provider. Document Revised: 08/14/2020 Document Reviewed: 08/14/2020 Elsevier Patient Education  2024 Elsevier Inc.  

## 2023-04-30 NOTE — Progress Notes (Signed)
ROB , no questions or concerns today. Labor precautions reviewed. Discussed NST next visit due to [redacted] wks gestation.Discussed induction 41 wks should she go past her due date.   SVE/ membranes sweep per pt request: 2-3 cm/ thick/-2. Cervix has funneling external os 4. Vertex presentation.    ROB 1 wk with NST.   Doreene Burke, CNM

## 2023-05-01 ENCOUNTER — Encounter: Payer: Self-pay | Admitting: Obstetrics and Gynecology

## 2023-05-07 ENCOUNTER — Ambulatory Visit (INDEPENDENT_AMBULATORY_CARE_PROVIDER_SITE_OTHER): Payer: Medicaid Other | Admitting: Certified Nurse Midwife

## 2023-05-07 ENCOUNTER — Ambulatory Visit (INDEPENDENT_AMBULATORY_CARE_PROVIDER_SITE_OTHER): Payer: Medicaid Other

## 2023-05-07 ENCOUNTER — Inpatient Hospital Stay: Admit: 2023-05-07 | Payer: Medicaid Other

## 2023-05-07 VITALS — BP 112/69 | HR 94 | Wt 165.0 lb

## 2023-05-07 VITALS — BP 112/69 | HR 94 | Ht 64.0 in | Wt 165.0 lb

## 2023-05-07 DIAGNOSIS — Z3A40 40 weeks gestation of pregnancy: Secondary | ICD-10-CM | POA: Diagnosis not present

## 2023-05-07 DIAGNOSIS — O34219 Maternal care for unspecified type scar from previous cesarean delivery: Secondary | ICD-10-CM

## 2023-05-07 DIAGNOSIS — Z3480 Encounter for supervision of other normal pregnancy, unspecified trimester: Secondary | ICD-10-CM

## 2023-05-07 DIAGNOSIS — O480 Post-term pregnancy: Secondary | ICD-10-CM | POA: Insufficient documentation

## 2023-05-07 NOTE — Progress Notes (Signed)
    NURSE VISIT NOTE  Subjective:    Patient ID: Rebecca Mckay, female    DOB: 10-16-1991, 32 y.o.   MRN: [id number]  HPI  Patient is a 32 y.o. [name] female who presents for fetal monitoring per order from Doreene Burke, CNM.   Objective:    BP 112/69   Pulse 94   Ht 5\' 4"  (1.626 m)   Wt 165 lb (74.8 kg)   LMP 07/31/2022 (Exact Date)   BMI 28.32 kg/m  Estimated Date of Delivery: 05/07/23  [redacted]w[redacted]d  Fetus A Non-Stress Test Interpretation for 05/07/23  Indication: Post Dates  Fetal Heart Rate A Mode: External Baseline Rate (A): 150 bpm Variability: Moderate Accelerations: 15 x 15 Decelerations: None Multiple birth?: No  Uterine Activity Mode: Toco Contraction Frequency (min): 8 Contraction Duration (sec): 90-100 Contraction Quality: Mild Resting Time: Adequate  Interpretation (Fetal Testing) Nonstress Test Interpretation: Reactive Overall Impression: Reassuring for gestational age   Assessment:   1. Post-term pregnancy, 40-42 weeks of gestation   2. [redacted] weeks gestation of pregnancy      Plan:   Results reviewed and discussed with patient by  Hartley Barefoot, CNM.     Rocco Serene, LPN

## 2023-05-07 NOTE — Progress Notes (Signed)
f   PRENATAL VISIT NOTE  Subjective:  Rebecca Mckay is a 32 y.o. [id number] at [redacted]w[redacted]d being seen today for ongoing prenatal care.  She is currently monitored for the following issues for this low-risk pregnancy and has Patient desires vaginal birth after cesarean section (VBAC); Gestational thrombocytopenia (HCC); Supervision of other normal pregnancy, antepartum; Post-term pregnancy, 40-42 weeks of gestation; and [redacted] weeks gestation of pregnancy on their problem list.  Patient reports occasional contractions.  Contractions: Irritability. Vag. Bleeding: None.  Movement: Present. Denies leaking of fluid.   Desires to schedule 41w IOL.  The following portions of the patient's history were reviewed and updated as appropriate: allergies, current medications, past family history, past medical history, past social history, past surgical history and problem list.   Objective:   Vitals:   05/07/23 1315  BP: 112/69  Pulse: 94  Weight: 165 lb (74.8 kg)   Total weight gain: 22 lb (9.979 kg) Fetal Status: Fetal Heart Rate (bpm): 140 (RNST)   Movement: Present  Presentation: Vertex   General:  Alert, oriented and cooperative. Patient is in no acute distress.  Skin: Skin is warm and dry. No rash noted.   Cardiovascular: Normal heart rate noted  Respiratory: Normal respiratory effort, no problems with respiration noted  Abdomen: Soft, gravid, appropriate for gestational age.  Pain/Pressure: Present     Pelvic:  Dilation: 3 Effacement (%): 50 Station: -2  Extremities: Normal range of motion.     Mental Status: Normal mood and affect. Normal behavior. Normal judgment and thought content.   Assessment and Plan:  Pregnancy: [id number] at [redacted]w[redacted]d 1. Supervision of other normal pregnancy, antepartum  2. Patient desires vaginal birth after cesarean section (VBAC) PDIOL scheduled for [redacted]w[redacted]d 7/23 8am arrival. Membranes swept today. Continue at home labor encouragement, term labor precautions reviewed.  Term  labor symptoms and general obstetric precautions including but not limited to vaginal bleeding, contractions, leaking of fluid and fetal movement were reviewed in detail with the patient. Please refer to After Visit Summary for other counseling recommendations.   No follow-ups on file.  No future appointments.   Dominica Severin, CNM

## 2023-05-07 NOTE — Patient Instructions (Signed)

## 2023-05-07 NOTE — Patient Instructions (Signed)

## 2023-05-08 ENCOUNTER — Encounter: Payer: Self-pay | Admitting: Certified Nurse Midwife

## 2023-05-09 ENCOUNTER — Inpatient Hospital Stay: Payer: Medicaid Other | Admitting: Anesthesiology

## 2023-05-09 ENCOUNTER — Telehealth: Payer: Self-pay

## 2023-05-09 ENCOUNTER — Encounter: Payer: Self-pay | Admitting: Obstetrics and Gynecology

## 2023-05-09 ENCOUNTER — Other Ambulatory Visit: Payer: Self-pay

## 2023-05-09 ENCOUNTER — Inpatient Hospital Stay
Admission: EM | Admit: 2023-05-09 | Discharge: 2023-05-10 | Disposition: A | Discharge status: Home / Self Care | Payer: Medicaid Other

## 2023-05-09 ENCOUNTER — Encounter: Admission: EM | Disposition: A | Discharge status: Home / Self Care | Payer: Self-pay | Source: Home / Self Care

## 2023-05-09 DIAGNOSIS — O34219 Maternal care for unspecified type scar from previous cesarean delivery: Secondary | ICD-10-CM | POA: Diagnosis present

## 2023-05-09 DIAGNOSIS — O99113 Other diseases of the blood and blood-forming organs and certain disorders involving the immune mechanism complicating pregnancy, third trimester: Secondary | ICD-10-CM | POA: Diagnosis not present

## 2023-05-09 DIAGNOSIS — K604 Rectal fistula: Secondary | ICD-10-CM | POA: Diagnosis present

## 2023-05-09 DIAGNOSIS — O9912 Other diseases of the blood and blood-forming organs and certain disorders involving the immune mechanism complicating childbirth: Secondary | ICD-10-CM | POA: Diagnosis present

## 2023-05-09 DIAGNOSIS — Z3480 Encounter for supervision of other normal pregnancy, unspecified trimester: Principal | ICD-10-CM

## 2023-05-09 DIAGNOSIS — O99892 Other specified diseases and conditions complicating childbirth: Secondary | ICD-10-CM | POA: Diagnosis present

## 2023-05-09 DIAGNOSIS — O99119 Other diseases of the blood and blood-forming organs and certain disorders involving the immune mechanism complicating pregnancy, unspecified trimester: Secondary | ICD-10-CM | POA: Diagnosis present

## 2023-05-09 DIAGNOSIS — O480 Post-term pregnancy: Secondary | ICD-10-CM | POA: Diagnosis present

## 2023-05-09 DIAGNOSIS — D696 Thrombocytopenia, unspecified: Secondary | ICD-10-CM | POA: Diagnosis present

## 2023-05-09 DIAGNOSIS — Z3A40 40 weeks gestation of pregnancy: Secondary | ICD-10-CM | POA: Diagnosis not present

## 2023-05-09 HISTORY — PX: PERINEAL LACERATION REPAIR: SHX5389

## 2023-05-09 LAB — CBC
HCT: 43.7 % (ref 36.0–46.0)
Hemoglobin: 14.5 g/dL (ref 12.0–15.0)
MCH: 28.9 pg (ref 26.0–34.0)
MCHC: 33.2 g/dL (ref 30.0–36.0)
MCV: 87.2 fL (ref 80.0–100.0)
Platelets: 102 10*3/uL — ABNORMAL LOW (ref 150–400)
RBC: 5.01 MIL/uL (ref 3.87–5.11)
RDW: 14.8 % (ref 11.5–15.5)
WBC: 7.1 10*3/uL (ref 4.0–10.5)
nRBC: 0 % (ref 0.0–0.2)

## 2023-05-09 LAB — TYPE AND SCREEN
ABO/RH(D): A POS
Antibody Screen: NEGATIVE

## 2023-05-09 SURGERY — SUTURE REPAIR, LACERATION, PERINEUM
Anesthesia: General

## 2023-05-09 MED ORDER — SIMETHICONE 80 MG PO CHEW: 80.0000 mg | CHEWABLE_TABLET | ORAL | Status: DC | PRN

## 2023-05-09 MED ORDER — IBUPROFEN 600 MG PO TABS
600.0000 mg | ORAL_TABLET | Freq: Four times a day (QID) | ORAL | Status: DC
  Administered 2023-05-09 – 2023-05-10 (×4): 600 mg via ORAL
  Filled 2023-05-09 (×4): qty 1

## 2023-05-09 MED ORDER — PHENYLEPHRINE 80 MCG/ML (10ML) SYRINGE FOR IV PUSH (FOR BLOOD PRESSURE SUPPORT): 80.0000 ug | PREFILLED_SYRINGE | INTRAVENOUS | Status: DC | PRN

## 2023-05-09 MED ORDER — ONDANSETRON HCL 4 MG/2ML IJ SOLN: 4.0000 mg | Freq: Once | INTRAMUSCULAR | Status: DC | PRN

## 2023-05-09 MED ORDER — OXYCODONE HCL 5 MG PO TABS: 10.0000 mg | ORAL_TABLET | ORAL | Status: DC | PRN

## 2023-05-09 MED ORDER — METHYLERGONOVINE MALEATE 0.2 MG/ML IJ SOLN
0.2000 mg | Freq: Once | INTRAMUSCULAR | Status: AC
  Administered 2023-05-09: 0.2 mg via INTRAMUSCULAR

## 2023-05-09 MED ORDER — ACETAMINOPHEN 10 MG/ML IV SOLN
INTRAVENOUS | Status: DC | PRN
  Administered 2023-05-09: 1000 mg via INTRAVENOUS

## 2023-05-09 MED ORDER — OXYTOCIN-SODIUM CHLORIDE 30-0.9 UT/500ML-% IV SOLN
2.5000 [IU]/h | INTRAVENOUS | Status: DC
  Administered 2023-05-09: 2.5 [IU]/h via INTRAVENOUS

## 2023-05-09 MED ORDER — DIPHENHYDRAMINE HCL 50 MG/ML IJ SOLN: 12.5000 mg | INTRAMUSCULAR | Status: DC | PRN

## 2023-05-09 MED ORDER — DEXAMETHASONE SODIUM PHOSPHATE 10 MG/ML IJ SOLN
INTRAMUSCULAR | Status: DC | PRN
  Administered 2023-05-09: 10 mg via INTRAVENOUS

## 2023-05-09 MED ORDER — OXYTOCIN 10 UNIT/ML IJ SOLN
10.0000 [IU] | Freq: Once | INTRAMUSCULAR | Status: AC
  Administered 2023-05-09: 10 [IU] via INTRAMUSCULAR

## 2023-05-09 MED ORDER — COCONUT OIL OIL: 1.0000 | TOPICAL_OIL | Status: DC | PRN

## 2023-05-09 MED ORDER — LACTATED RINGERS IV SOLN: INTRAVENOUS | Status: DC

## 2023-05-09 MED ORDER — EPHEDRINE 5 MG/ML INJ: 10.0000 mg | INTRAVENOUS | Status: DC | PRN

## 2023-05-09 MED ORDER — ONDANSETRON HCL 4 MG/2ML IJ SOLN: 4.0000 mg | Freq: Four times a day (QID) | INTRAMUSCULAR | Status: DC | PRN

## 2023-05-09 MED ORDER — ONDANSETRON HCL 4 MG/2ML IJ SOLN
INTRAMUSCULAR | Status: DC | PRN
  Administered 2023-05-09: 4 mg via INTRAVENOUS

## 2023-05-09 MED ORDER — DEXMEDETOMIDINE HCL IN NACL 80 MCG/20ML IV SOLN
INTRAVENOUS | Status: DC | PRN
  Administered 2023-05-09: 12 ug via INTRAVENOUS

## 2023-05-09 MED ORDER — FENTANYL CITRATE (PF) 100 MCG/2ML IJ SOLN: 25.0000 ug | INTRAMUSCULAR | Status: DC | PRN

## 2023-05-09 MED ORDER — LIDOCAINE HCL (PF) 1 % IJ SOLN
30.0000 mL | INTRAMUSCULAR | Status: AC | PRN
  Administered 2023-05-09: 30 mL via SUBCUTANEOUS

## 2023-05-09 MED ORDER — FENTANYL CITRATE (PF) 100 MCG/2ML IJ SOLN: 100.0000 ug | INTRAMUSCULAR | Status: DC | PRN

## 2023-05-09 MED ORDER — BENZOCAINE-MENTHOL 20-0.5 % EX AERO
1.0000 | INHALATION_SPRAY | CUTANEOUS | Status: DC | PRN
  Administered 2023-05-09: 1 via TOPICAL
  Filled 2023-05-09: qty 56

## 2023-05-09 MED ORDER — DIPHENHYDRAMINE HCL 25 MG PO CAPS: 25.0000 mg | ORAL_CAPSULE | Freq: Four times a day (QID) | ORAL | Status: DC | PRN

## 2023-05-09 MED ORDER — ACETAMINOPHEN 325 MG PO TABS
650.0000 mg | ORAL_TABLET | ORAL | Status: DC | PRN
  Administered 2023-05-09 – 2023-05-10 (×4): 650 mg via ORAL
  Filled 2023-05-09 (×4): qty 2

## 2023-05-09 MED ORDER — FENTANYL CITRATE (PF) 100 MCG/2ML IJ SOLN
INTRAMUSCULAR | Status: DC | PRN
  Administered 2023-05-09 (×2): 50 ug via INTRAVENOUS

## 2023-05-09 MED ORDER — CEFAZOLIN SODIUM-DEXTROSE 2-3 GM-%(50ML) IV SOLR
INTRAVENOUS | Status: DC | PRN
  Administered 2023-05-09: 2 g via INTRAVENOUS

## 2023-05-09 MED ORDER — ZOLPIDEM TARTRATE 5 MG PO TABS: 5.0000 mg | ORAL_TABLET | Freq: Every evening | ORAL | Status: DC | PRN

## 2023-05-09 MED ORDER — OXYCODONE HCL 5 MG PO TABS: 5.0000 mg | ORAL_TABLET | Freq: Once | ORAL | Status: DC | PRN

## 2023-05-09 MED ORDER — MIDAZOLAM HCL 2 MG/2ML IJ SOLN
INTRAMUSCULAR | Status: DC | PRN
  Administered 2023-05-09: 2 mg via INTRAVENOUS

## 2023-05-09 MED ORDER — OXYCODONE HCL 5 MG PO TABS: 5.0000 mg | ORAL_TABLET | ORAL | Status: DC | PRN

## 2023-05-09 MED ORDER — SENNOSIDES-DOCUSATE SODIUM 8.6-50 MG PO TABS
2.0000 | ORAL_TABLET | Freq: Every day | ORAL | Status: DC
  Administered 2023-05-10: 2 via ORAL
  Filled 2023-05-09: qty 2

## 2023-05-09 MED ORDER — DIBUCAINE (PERIANAL) 1 % EX OINT
1.0000 | TOPICAL_OINTMENT | CUTANEOUS | Status: DC | PRN
  Administered 2023-05-09: 1 via RECTAL
  Filled 2023-05-09: qty 28

## 2023-05-09 MED ORDER — ACETAMINOPHEN 10 MG/ML IV SOLN: 1000.0000 mg | Freq: Once | INTRAVENOUS | Status: DC | PRN

## 2023-05-09 MED ORDER — ONDANSETRON HCL 4 MG/2ML IJ SOLN: 4.0000 mg | INTRAMUSCULAR | Status: DC | PRN

## 2023-05-09 MED ORDER — ONDANSETRON HCL 4 MG PO TABS: 4.0000 mg | ORAL_TABLET | ORAL | Status: DC | PRN

## 2023-05-09 MED ORDER — LACTATED RINGERS IV SOLN
500.0000 mL | INTRAVENOUS | Status: DC | PRN
  Administered 2023-05-09: 1000 mL via INTRAVENOUS

## 2023-05-09 MED ORDER — OXYTOCIN BOLUS FROM INFUSION: 333.0000 mL | Freq: Once | INTRAVENOUS | Status: DC

## 2023-05-09 MED ORDER — WITCH HAZEL-GLYCERIN EX PADS
1.0000 | MEDICATED_PAD | CUTANEOUS | Status: DC | PRN
  Administered 2023-05-09: 1 via TOPICAL
  Filled 2023-05-09: qty 100

## 2023-05-09 MED ORDER — SOD CITRATE-CITRIC ACID 500-334 MG/5ML PO SOLN: 30.0000 mL | ORAL | Status: DC | PRN

## 2023-05-09 MED ORDER — LACTATED RINGERS IV SOLN: 500.0000 mL | Freq: Once | INTRAVENOUS | Status: DC

## 2023-05-09 MED ORDER — 0.9 % SODIUM CHLORIDE (POUR BTL) OPTIME
TOPICAL | Status: DC | PRN
  Administered 2023-05-09: 500 mL

## 2023-05-09 MED ORDER — PRENATAL MULTIVITAMIN CH
1.0000 | ORAL_TABLET | Freq: Every day | ORAL | Status: DC
  Administered 2023-05-10: 1 via ORAL
  Filled 2023-05-09: qty 1

## 2023-05-09 MED ORDER — ACETAMINOPHEN 325 MG PO TABS: 650.0000 mg | ORAL_TABLET | ORAL | Status: DC | PRN

## 2023-05-09 MED ORDER — FENTANYL-BUPIVACAINE-NACL 0.5-0.125-0.9 MG/250ML-% EP SOLN: 12.0000 mL/h | EPIDURAL | Status: DC | PRN

## 2023-05-09 MED ORDER — LIDOCAINE HCL (CARDIAC) PF 100 MG/5ML IV SOSY
PREFILLED_SYRINGE | INTRAVENOUS | Status: DC | PRN
  Administered 2023-05-09: 50 mg via INTRAVENOUS

## 2023-05-09 MED ORDER — PROPOFOL 10 MG/ML IV BOLUS
INTRAVENOUS | Status: DC | PRN
  Administered 2023-05-09: 30 mg via INTRAVENOUS

## 2023-05-09 MED ORDER — OXYCODONE HCL 5 MG/5ML PO SOLN: 5.0000 mg | Freq: Once | ORAL | Status: DC | PRN

## 2023-05-09 MED ORDER — PROPOFOL 500 MG/50ML IV EMUL
INTRAVENOUS | Status: DC | PRN
  Administered 2023-05-09: 150 ug/kg/min via INTRAVENOUS

## 2023-05-09 SURGICAL SUPPLY — 37 items
BAG DRN RND TRDRP ANRFLXCHMBR (UROLOGICAL SUPPLIES) ×1
BAG URINE DRAIN 2000ML AR STRL (UROLOGICAL SUPPLIES) ×1 IMPLANT
CATH FOLEY 2WAY 5CC 16FR (CATHETERS) ×1
CATH URTH 16FR FL 2W BLN LF (CATHETERS) ×1 IMPLANT
DRAPE 3/4 80X56 (DRAPES) ×1 IMPLANT
DRAPE PERI LITHO V/GYN (MISCELLANEOUS) ×1 IMPLANT
DRAPE UNDER BUTTOCK W/FLU (DRAPES) ×1 IMPLANT
ELECT REM PT RETURN 9FT ADLT (ELECTROSURGICAL) ×1
ELECTRODE REM PT RTRN 9FT ADLT (ELECTROSURGICAL) ×1 IMPLANT
GAUZE 4X4 16PLY ~~LOC~~+RFID DBL (SPONGE) ×2 IMPLANT
GAUZE PACK 2X3YD (PACKING) ×1 IMPLANT
GLOVE BIO SURGEON STRL SZ 6.5 (GLOVE) ×1 IMPLANT
GLOVE BIOGEL PI IND STRL 6.5 (GLOVE) IMPLANT
GLOVE INDICATOR 7.0 STRL GRN (GLOVE) ×1 IMPLANT
GLOVE SURG SYN 6.5 ES PF (GLOVE) ×1 IMPLANT
GLOVE SURG SYN 6.5 PF PI (GLOVE) IMPLANT
GOWN STRL REUS W/ TWL LRG LVL3 (GOWN DISPOSABLE) ×2 IMPLANT
GOWN STRL REUS W/ TWL XL LVL3 (GOWN DISPOSABLE) IMPLANT
GOWN STRL REUS W/TWL LRG LVL3 (GOWN DISPOSABLE) ×2
GOWN STRL REUS W/TWL XL LVL3 (GOWN DISPOSABLE) ×1
IV LACTATED RINGERS 1000ML (IV SOLUTION) ×1 IMPLANT
KIT TURNOVER CYSTO (KITS) ×1 IMPLANT
LABEL OR SOLS (LABEL) ×1 IMPLANT
MANIFOLD NEPTUNE II (INSTRUMENTS) ×1 IMPLANT
NDL HYPO 22X1.5 SAFETY MO (MISCELLANEOUS) ×1 IMPLANT
NEEDLE HYPO 22X1.5 SAFETY MO (MISCELLANEOUS) ×1 IMPLANT
NS IRRIG 500ML POUR BTL (IV SOLUTION) ×1 IMPLANT
PACK BASIN MINOR ARMC (MISCELLANEOUS) ×1 IMPLANT
PAD OB MATERNITY 4.3X12.25 (PERSONAL CARE ITEMS) ×1 IMPLANT
PAD PREP OB/GYN DISP 24X41 (PERSONAL CARE ITEMS) ×1 IMPLANT
SCRUB CHG 4% DYNA-HEX 4OZ (MISCELLANEOUS) ×1 IMPLANT
SPONGE KITTNER 5P (MISCELLANEOUS) IMPLANT
SUT VIC AB 2-0 CT2 27 (SUTURE) IMPLANT
SUT VICRYL 3-0 27IN (SUTURE) ×3 IMPLANT
SYR CONTROL 10ML LL (SYRINGE) ×1 IMPLANT
TRAP FLUID SMOKE EVACUATOR (MISCELLANEOUS) ×1 IMPLANT
WATER STERILE IRR 500ML POUR (IV SOLUTION) ×1 IMPLANT

## 2023-05-09 NOTE — Progress Notes (Addendum)
     OB/GYN Attending Progress Note   Called by [name] Penner to assess patient for abnormal vaginal tear after delivery, possible 4th degree laceration.  Patient is a 32 y.o. [phone] female with h/o C/S x 1 with successful VBAC who presented in advanced labor.  Patient went on to delivery spontaneously after 2 pushes under no anesthesia.   Upon my exam, perineum with second, possible 3rd degree laceration that advances towards the right vaginal wall, however approximately 3 cm beyond perineum there is a separate defect in the posterior vagina, ~ 2 cm where upon rectal exam the digit is visibly seen, with small amount of stool at rectum.  Discussed need for more detailed exam and operative management, likely under epidural or general anesthesia. Patient notes understanding.  Will notify Anesthesia.    Hildred Laser, MD [facility]

## 2023-05-09 NOTE — H&P (Signed)
[facility] Labor & Delivery  History and Physical  ASSESSMENT AND PLAN   Rebecca Mckay is a 32 y.o. [id number] at [redacted]w[redacted]d with EDD: 05/07/2023, by Last Menstrual Period admitted for labor management.  Labor Management/TOLAC - Grossly ruptured on exam with moderate meconium present. SVE 8/100/-1.  - Patient strongly desires epidural, will plan on placement once admission labs are back.  - History of Csx1 followed by successful VBAC. Consents signed prenatally and on admission. - Anticipate VBAC.  Fetal Status: - cephalic presentation by sutures on SVE - EFW: 7.5lbs by Leopold's - Continuous - FHT currently cat II, late decelerations  Other Problems: Gestational Thrombocytopenia - CBC on admission  Labs/Immunizations: TDAP: UTD Flu: n/a Rubella: immune Varicella immune HIV: neg Hep B: neg Hep C: neg RPR: neg GBS: neg  Lab Results  Component Value Date   VZVIGG 2,101 10/09/2022   HIV Non Reactive 02/12/2023   HIV Non-reactive 12/05/2020   HIV non reactive 12/05/2020     Postpartum Plan: - Feeding: Breast Milk - Contraception: plans Nexplanon - Prenatal Care Provider: AOB  Attending Dr. Valentino Saxon was immediately available for the care of the patient.     HPI   Chief Complaint: Contractions and leaking fluid  Rebecca Mckay is a 32 y.o. [id number] at [redacted]w[redacted]d who presents for evaluation of labor.  She states that regular painful contractions started around 0400 this morning. She had some bloody show at that time. On the way to the hospital, around 1100, she reports a large gush of fluid that is meconium stained. Contractions continue to intensify.   Pregnancy Complications Patient Active Problem List   Diagnosis Date Noted   Labor and delivery indication for care or intervention 05/09/2023   Supervision of other normal pregnancy, antepartum 09/19/2022   Patient desires vaginal birth after cesarean section (VBAC) 05/22/2021   Gestational  thrombocytopenia (HCC) 05/22/2021    Review of Systems A twelve point review of systems was negative except as stated in HPI.   HISTORY   Medications Medications Prior to Admission  Medication Sig Dispense Refill Last Dose   Prenatal Vit-Fe Fumarate-FA (PRENATAL MULTIVITAMIN) TABS tablet Take 1 tablet by mouth daily at 12 noon.   05/08/2023    Allergies has No Known Allergies.   OB History OB History  Gravida Para Term Preterm AB Living  5 2 2  0 2 2  SAB IAB Ectopic Multiple Live Births  0 2 0 0 2    # Outcome Date GA Lbr Len/2nd Weight Sex Type Anes PTL Lv  5 Current           4 Term 07/11/21 [redacted]w[redacted]d / 01:12 3730 g F VBAC EPI  LIV     Name: Rebecca Mckay     Apgar1: 8  Apgar5: 9  3 Term 10/05/19   3289 g F CS-Unspec   LIV  2 IAB 2014          1 IAB 2010            Past Medical History Past Medical History:  Diagnosis Date   No pertinent past medical history     Past Surgical History Past Surgical History:  Procedure Laterality Date   BREAST MASS EXCISION Left    BREAST SURGERY     CESAREAN SECTION     WISDOM TOOTH EXTRACTION      Social History  reports that she has never smoked. She has never used smokeless tobacco. She reports that she does not  drink alcohol and does not use drugs.   Family History family history includes Breast cancer (age of onset: 8) in her paternal grandmother; Healthy in her father, maternal grandfather, maternal grandmother, mother, sister, and sister.   PHYSICAL EXAM   Vitals:   05/09/23 1100  BP: 138/78  Pulse: 81  Resp: 16  Weight: 74.9 kg  Height: 5\' 4"  (1.626 m)    Constitutional: No acute distress, well appearing, and well nourished. Neurologic: She is alert and conversational.  Psychiatric: She has a normal mood and affect.  Musculoskeletal: Normal gait, grossly normal range of motion Cardiovascular: Normal rate.   Pulmonary/Chest: Normal work of breathing.  Gastrointestinal/Abdominal: Soft. Gravid. There is  no tenderness.  Skin: Skin is warm and dry. No rash noted.  Genitourinary: Normal external female genitalia.  SVE:   Dilation: 8 Effacement (%): 100 Station: -1 Exam by:: Derisha Funderburke, CNM SSE: deferred  NST Interpretation Indication: Contractions Baseline: 125 bpm Variability: moderate Accelerations: present Decelerations: late decelerations Contractions: regular, every 5 minutes Time noted:  See OBIX Impression: Cat II tracing, will continue to monitor Authenticated by: Lindalou Hose [employer]

## 2023-05-09 NOTE — Discharge Summary (Signed)
Postpartum Discharge Summary  Date of Service updated***     Patient Name: Rebecca Mckay DOB: Dec 22, 1990 MRN: [id number]  Date of admission: 05/09/2023 Delivery date:05/09/2023 Delivering provider: Rozena Fierro, Lindalou Hose Date of discharge: 05/09/2023  Admitting diagnosis: Labor and delivery indication for care or intervention [O75.9] Intrauterine pregnancy: [redacted]w[redacted]d     Secondary diagnosis:  Principal Problem:   Labor and delivery indication for care or intervention Active Problems:   Patient desires vaginal birth after cesarean section (VBAC)   Gestational thrombocytopenia (HCC)   Supervision of other normal pregnancy, antepartum    Discharge diagnosis: Term Pregnancy Delivered                                              Post partum procedures:{Postpartum procedures:23558} Augmentation: N/A Complications: Rectal fistula requiring operative repair  Hospital course: Onset of Labor With Vaginal Delivery      32 y.o. yo [id number] at [redacted]w[redacted]d was admitted in Active Labor on 05/09/2023. Labor course was uncomplicated. Membrane Rupture Time/Date: 10:45 AM,05/09/2023  Delivery Method:VBAC, Spontaneous Episiotomy: None Lacerations:  2nd degree and rectal fistula repaired by Dr. Valentino Saxon. Patient had a postpartum course complicated by ***.  She is ambulating, tolerating a regular diet, passing flatus, and urinating well. Patient is discharged home in stable condition on 05/09/23.  Newborn Data: Birth date:05/09/2023 Birth time:11:59 AM Gender:Female Living status:Living Apgars:8 ,9  Weight:3650 g  Magnesium Sulfate received: {Mag received:30440022} BMZ received: No Rhophylac:N/A MMR:N/A T-DaP:Given prenatally Flu: N/A Transfusion:{Transfusion received:30440034}  Physical exam  Vitals:   05/09/23 1547 05/09/23 1600 05/09/23 1716 05/09/23 2008  BP: (!) 114/57 123/78 132/76 116/76  Pulse: 74 73 76 85  Resp: 20 18 18 16   Temp: 97.8 F (36.6 C) 98.2 F (36.8 C) 98.6 F (37 C) 99 F  (37.2 C)  TempSrc:  Oral Oral Oral  SpO2: 98% 99% 100% 96%  Weight:      Height:       General: {Exam; general:21111117} Lochia: {Desc; appropriate/inappropriate:30686::"appropriate"} Uterine Fundus: {Desc; firm/soft:30687} Incision: {Exam; incision:21111123} DVT Evaluation: {Exam; dvt:2111122} Labs: Lab Results  Component Value Date   WBC 7.1 05/09/2023   HGB 14.5 05/09/2023   HCT 43.7 05/09/2023   MCV 87.2 05/09/2023   PLT 102 (L) 05/09/2023      Latest Ref Rng & Units 01/16/2022    3:44 PM  CMP  Glucose 70 - 99 mg/dL 79   BUN 6 - 20 mg/dL 15   Creatinine 1.47 - 1.00 mg/dL 8.29   Sodium 562 - 130 mmol/L 143   Potassium 3.5 - 5.2 mmol/L 4.4   Chloride 96 - 106 mmol/L 104   CO2 20 - 29 mmol/L 24   Calcium 8.7 - 10.2 mg/dL 9.1   Total Protein 6.0 - 8.5 g/dL 7.3   Total Bilirubin 0.0 - 1.2 mg/dL <8.6   Alkaline Phos 44 - 121 IU/L 63   AST 0 - 40 IU/L 15   ALT 0 - 32 IU/L 15    Edinburgh Score:    05/09/2023    4:00 PM  Edinburgh Postnatal Depression Scale Screening Tool  I have been able to laugh and see the funny side of things. --      After visit meds:  Allergies as of 05/09/2023   No Known Allergies   Med Rec must be completed prior to using this [facility]***  Discharge home in stable condition Infant Feeding: {Baby feeding:23562} Infant Disposition:{CHL IP OB HOME WITH ZOXWRU:04540} Discharge instruction: per After Visit Summary and Postpartum booklet. Activity: Advance as tolerated. Pelvic rest for 6 weeks.  Diet: {OB diet:21111121} Anticipated Birth Control: {Birth Control:23956} Postpartum Appointment:{Outpatient follow up:23559} Additional Postpartum F/U: {PP Procedure:23957} Future Appointments:No future appointments. Follow up Visit:  Follow-up Information     Hildred Laser, MD Follow up in 2 week(s).   Specialties: Obstetrics and Gynecology, Radiology Why: In person for laceration check Contact information: [address] [place] [address] [phone]                     05/09/2023 Lindalou Hose Marlin Jarrard, CNM

## 2023-05-09 NOTE — Op Note (Addendum)
Procedure(s): SUTURE REPAIR PERINEAL LACERATION Procedure Note  [name] female 32 y.o. 05/09/2023  Indications: The patient is a 32 y.o. W1X9147 female PPD#0 s/p VBAC delivery, with complicated vaginal laceration.  Pre-operative Diagnosis: s/p vaginal delivery (VBAC), complicated vaginal tear  Post-operative Diagnosis: Same  Surgeon: Hildred Laser, MD  Assistants:  Autumn Messing, CNM.   Anesthesia: General anesthesia  Findings: Exam under anesthesia notes shallow second degree laceration with perineal body intact, with defect at the apex of the laceration with extension into the rectum. Thin perineal body and vaginal mucosa posteriorly.    Procedure Details: The patient was seen in the Holding Room. The risks, benefits, complications, treatment options, and expected outcomes were discussed with the patient.  The patient concurred with the proposed plan, giving informed consent.  The site of surgery properly noted. The patient was taken to the Operating Room, identified as [name] and the procedure verified as Procedure(s) (LRB): SUTURE REPAIR PERINEAL LACERATION (N/A). A Time Out was held and the above information confirmed.  She was then placed under general anesthesia without difficulty. She was placed in the dorsal lithotomy position, and was prepped and draped in a sterile manner.  A straight catheterization was performed. An exam under anesthesia was performed, which noted a shallow 2nd degree perineal laceration approximately 1.5 cm in length with majority of the perineal body intact, however at apex of the laceration there appeared to be deeper extension approximately 2 cm in depth, 1.5 cm in length. Upon performance of rectal exam, a digit could be introduced from the rectum into the vaginal cavity without effort. The perineal body was noted to be very thin, poor tissue support.   The rectal mucosa was then grasped with Allice clamps and reapproximated with a running  suture of 3-0 Vicry, repaired in 2 layers. The second degree laceration was then repaired in a normal running fashion using a 3-0 Vicryl, with care made to reinforce the vaginal tissue overlaying the rectal repair.   All instruments were then removed from the vagina. The patient tolerated the procedures well.  All instruments, needles, and sponge counts were correct x 2. The patient was taken to the recovery room awake, extubated and in stable condition. She received 2 grams of Ancef at start of the procedure.   An experienced assistant was required given the standard of surgical care given the complexity of the case.  This assistant was needed for exposure, dissection, suctioning, retraction, instrument exchange, and for overall help during the procedure.   Estimated Blood Loss:  10 ml      Drains: straight catheterization prior to procedure with  400 ml of clear urine         Total IV Fluids:  510 ml  Specimens: None         Implants: None         Complications:  None; patient tolerated the procedure well.         Disposition: PACU - hemodynamically stable.         Condition: stable   Hildred Laser, MD [facility] at [facility]

## 2023-05-09 NOTE — Transfer of Care (Signed)
Immediate Anesthesia Transfer of Care Note  Patient: Rebecca Mckay  Procedure(s) Performed: SUTURE REPAIR PERINEAL LACERATION  Patient Location: PACU  Anesthesia Type:MAC  Level of Consciousness: drowsy, patient cooperative, and responds to stimulation  Airway & Oxygen Therapy: Patient Spontanous Breathing  Post-op Assessment: Report given to RN and Post -op Vital signs reviewed and stable  Post vital signs: Reviewed and stable  Last Vitals:  Vitals Value Taken Time  BP 127/75 05/09/23 1525  Temp    Pulse 82 05/09/23 1528  Resp 17 05/09/23 1528  SpO2 97 % 05/09/23 1528  Vitals shown include unfiled device data.  Last Pain:  Vitals:   05/09/23 1411  TempSrc:   PainSc: 3          Complications: No notable events documented.

## 2023-05-09 NOTE — Telephone Encounter (Signed)
Pt called back and said she is heading to hospital, having back to back contractions and some vaginal bleeding (spotting).

## 2023-05-09 NOTE — Progress Notes (Signed)
Fundus firm and 1 fingerbreadth below umbilicus.

## 2023-05-09 NOTE — Anesthesia Postprocedure Evaluation (Signed)
Anesthesia Post Note  Patient: Rebecca Mckay  Procedure(s) Performed: SUTURE REPAIR PERINEAL LACERATION  Patient location during evaluation: PACU Anesthesia Type: General Level of consciousness: awake and alert Pain management: pain level controlled Vital Signs Assessment: post-procedure vital signs reviewed and stable Respiratory status: spontaneous breathing, nonlabored ventilation, respiratory function stable and patient connected to nasal cannula oxygen Cardiovascular status: blood pressure returned to baseline and stable Postop Assessment: no apparent nausea or vomiting Anesthetic complications: no  No notable events documented.   Last Vitals:  Vitals:   05/09/23 1716 05/09/23 2008  BP: 132/76 116/76  Pulse: 76 85  Resp: 18 16  Temp: 37 C 37.2 C  SpO2: 100% 96%    Last Pain:  Vitals:   05/09/23 2008  TempSrc: Oral  PainSc:                  Stephanie Coup

## 2023-05-09 NOTE — Telephone Encounter (Signed)
Pt left msg on triage asking for a call back. She said she had a couple of qs regarding early labor signs before heading to hospital. Called her back, no answer, LVMTRC.

## 2023-05-09 NOTE — Anesthesia Preprocedure Evaluation (Signed)
Anesthesia Evaluation  Patient identified by MRN, date of birth, ID band Patient awake  General Assessment Comment:Post partum a few hours ago.  Has not eaten since 8pm last night. No fluids for at least 4 hours. Had some nausea during active labor, but none since.  Reviewed: Allergy & Precautions, NPO status , Patient's Chart, lab work & pertinent test results  History of Anesthesia Complications Negative for: history of anesthetic complications  Airway Mallampati: I  TM Distance: >3 FB Neck ROM: Full    Dental no notable dental hx. (+) Teeth Intact   Pulmonary neg pulmonary ROS, neg sleep apnea, neg COPD, Patient abstained from smoking.Not current smoker   Pulmonary exam normal breath sounds clear to auscultation       Cardiovascular Exercise Tolerance: Good METS(-) hypertension(-) CAD and (-) Past MI negative cardio ROS (-) dysrhythmias  Rhythm:Regular Rate:Normal - Systolic murmurs    Neuro/Psych negative neurological ROS  negative psych ROS   GI/Hepatic ,neg GERD  ,,(+)     (-) substance abuse    Endo/Other  neg diabetes    Renal/GU negative Renal ROS     Musculoskeletal   Abdominal   Peds  Hematology   Anesthesia Other Findings Past Medical History: No date: No pertinent past medical history  Reproductive/Obstetrics                             Anesthesia Physical Anesthesia Plan  ASA: 2  Anesthesia Plan: General   Post-op Pain Management: Ofirmev IV (intra-op)* and Toradol IV (intra-op)*   Induction: Intravenous  PONV Risk Score and Plan: 4 or greater and Propofol infusion, TIVA, Ondansetron, Midazolam and Dexamethasone  Airway Management Planned: Nasal Cannula  Additional Equipment: None  Intra-op Plan:   Post-operative Plan:   Informed Consent: I have reviewed the patients History and Physical, chart, labs and discussed the procedure including the risks, benefits  and alternatives for the proposed anesthesia with the patient or authorized representative who has indicated his/her understanding and acceptance.     Dental advisory given  Plan Discussed with: CRNA and Surgeon  Anesthesia Plan Comments: (Discussed risks of anesthesia with patient, including possibility of difficulty with spontaneous ventilation under anesthesia necessitating airway intervention, PONV, and rare risks such as cardiac or respiratory or neurological events, and allergic reactions. Discussed the role of CRNA in patient's perioperative care. Patient understands.)       Anesthesia Quick Evaluation

## 2023-05-10 ENCOUNTER — Encounter: Payer: Self-pay | Admitting: Obstetrics and Gynecology

## 2023-05-10 LAB — CBC
HCT: 36.6 % (ref 36.0–46.0)
Hemoglobin: 12.5 g/dL (ref 12.0–15.0)
MCH: 29.1 pg (ref 26.0–34.0)
MCHC: 34.2 g/dL (ref 30.0–36.0)
MCV: 85.3 fL (ref 80.0–100.0)
Platelets: 123 10*3/uL — ABNORMAL LOW (ref 150–400)
RBC: 4.29 MIL/uL (ref 3.87–5.11)
RDW: 14.6 % (ref 11.5–15.5)
WBC: 15 10*3/uL — ABNORMAL HIGH (ref 4.0–10.5)
nRBC: 0 % (ref 0.0–0.2)

## 2023-05-10 LAB — RPR: RPR Ser Ql: NONREACTIVE

## 2023-05-10 MED ORDER — IBUPROFEN 600 MG PO TABS: 600.0000 mg | ORAL_TABLET | Freq: Four times a day (QID) | ORAL | 0 refills | Status: AC

## 2023-05-10 NOTE — Progress Notes (Signed)
D/C home to self care with partner/verb understanding of d/c instructions

## 2023-05-13 ENCOUNTER — Telehealth: Payer: Self-pay

## 2023-05-13 NOTE — Telephone Encounter (Signed)
[facility]- Discharge Call Backs-Left Voicemail about the following below. 1-Do you have any questions or concerns about yourself as you heal?  C-Sec-Is your dressing off?/Vag Del? 2-Any concerns or questions about your baby? Is your baby eating, peeing,pooping well? 3-How was your stay at the hospital? 4- Did our team work together to care for you? You should be receiving a survey in the mail soon.   We would really appreciate it if you could fill that out for [place] and return it in the mail.  We value the feedback to make improvements and continue the great work we do.   If you have any questions please feel free to call me back at [phone]

## 2023-05-14 ENCOUNTER — Telehealth: Payer: Self-pay | Admitting: Obstetrics and Gynecology

## 2023-05-14 NOTE — Telephone Encounter (Signed)
Patient called to schedule a two week follow up.  I only have August 6, 7, and 8 at 11:15 or 4:15?    Please advise.  Charlene

## 2023-05-14 NOTE — Telephone Encounter (Signed)
Either date at 11:15

## 2023-05-15 NOTE — Telephone Encounter (Signed)
Patient has been scheduled for May 29, 2023 at 11:15

## 2023-05-26 ENCOUNTER — Encounter: Payer: Self-pay | Admitting: Obstetrics and Gynecology

## 2023-05-29 ENCOUNTER — Encounter: Payer: Self-pay | Admitting: Obstetrics and Gynecology

## 2023-05-29 ENCOUNTER — Telehealth (INDEPENDENT_AMBULATORY_CARE_PROVIDER_SITE_OTHER): Payer: Medicaid Other | Admitting: Obstetrics and Gynecology

## 2023-05-29 DIAGNOSIS — Z1332 Encounter for screening for maternal depression: Secondary | ICD-10-CM

## 2023-05-29 NOTE — Patient Instructions (Signed)

## 2023-05-29 NOTE — Progress Notes (Signed)
[facility] at [facility] [address] [place], [place]  [address] Phone:  [phone]   Fax:  [phone]    Virtual Visit via Video Note  I connected with [name] on 05/29/23 at  11:15 AM EDT by a video enabled telemedicine application and verified that I am speaking with the correct person using two identifiers.  Location: Patient: HOME  Provider: OFFICE   I discussed the limitations of evaluation and management by telemedicine and the availability of in person appointments. The patient expressed understanding and agreed to proceed.    History of Present Illness:   Rebecca Mckay is a 32 y.o. [phone] female who presents for a 2 week televisit for mood check. She is 2 weeks postpartum following a normal spontaneous vaginal delivery (VBAC).  The delivery was at 40 gestational weeks.  Postpartum course has been well so far. Baby is feeding by breast. Bleeding: spotting. Postpartum depression screening: negative.  EDPS score is 0.    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  Notes that she feels things are healing well, only having some mild itching at the site. Denies any pain.    The following portions of the patient's history were reviewed and updated as appropriate: allergies, current medications, past family history, past medical history, past social history, past surgical history, and problem list.   Observations/Objective:   Resp. rate 16, height 5\' 4"  (1.626 m), weight 154 lb (69.9 kg), currently breastfeeding. Gen App: NAD Psych: normal speech, affect. Good mood.        05/29/2023   11:26 AM 05/09/2023    9:06 PM 05/09/2023    4:00 PM 09/20/2022   10:31 AM 09/12/2021   11:32 AM  Edinburgh Postnatal Depression Scale Screening Tool  I have been able to laugh and see the funny side of things. 0 0 -- 0 0  I have looked forward with enjoyment to  things. 0 0  0 0  I have blamed myself unnecessarily when things went wrong. 0 0  0 1  I have been anxious or worried for no good reason. 0 0  1 0  I have felt scared or panicky for no good reason. 0 0  0 0  Things have been getting on top of me. 0 0  0 0  I have been so unhappy that I have had difficulty sleeping. 0 0  0 0  I have felt sad or miserable. 0 0  0 0  I have been so unhappy that I have been crying. 0 0  0 0  The thought of harming myself has occurred to me. 0 0  0 0  Edinburgh Postnatal Depression Scale Total 0 0  1 1         Assessment and Plan:   1. Encounter for screening for maternal depression - Screening Negative today. Will rescreen at 6 week postpartum visit. Overall doing well.    2. Postpartum state - Overall doing well. Continue routine postpartum home care.    3. Lactating mother - Doing well with breastfeeding  4. Perineal repair - Continue perineal care as previously instructed. Will follow up at postpartum visit.    Follow Up Instructions:     I discussed the assessment and treatment plan with the patient. The patient was provided an opportunity to ask questions and all were answered. The patient agreed with the plan and demonstrated an  understanding of the instructions.   The patient was advised to call back or seek an in-person evaluation if the symptoms worsen or if the condition fails to improve as anticipated.   I provided 5 minutes of non-face-to-face time during this encounter.     Hildred Laser, MD [facility]

## 2023-06-17 NOTE — Patient Instructions (Incomplete)

## 2023-06-17 NOTE — Progress Notes (Unsigned)
OBSTETRICS POSTPARTUM CLINIC PROGRESS NOTE  Subjective:     Rebecca Mckay is a 32 y.o. [phone] female who presents for a postpartum visit. She is 5 weeks postpartum following a spontaneous vaginal delivery. I have fully reviewed the prenatal and intrapartum course. The delivery was at 40.2 gestational weeks.  Anesthesia: none. Sustained a vaginal posterior laceration extending into the rectum, requiring surgical repair in the operating room under general anesthesia.  Postpartum course has been good. Baby's course has been going well. Baby is feeding by breast. Bleeding: patient has not not resumed menses, with No LMP recorded. Bowel function is normal. Bladder function is normal. Patient is not sexually active. Contraception method desired is Nexplanon. Postpartum depression screening: negative.  EDPS score is 0.    The following portions of the patient's history were reviewed and updated as appropriate: allergies, current medications, past family history, past medical history, past social history, past surgical history, and problem list.  Review of Systems Pertinent items are noted in HPI.   Objective:    BP (!) 130/93   Pulse 85   Resp 16   Ht 5\' 4"  (1.626 m)   Wt 147 lb 3.2 oz (66.8 kg)   Breastfeeding Yes   BMI 25.27 kg/m   General:  alert and no distress   Breasts:  inspection negative, no nipple discharge or bleeding, no masses or nodularity palpable  Lungs: clear to auscultation bilaterally  Heart:  regular rate and rhythm, S1, S2 normal, no murmur, click, rub or gallop  Abdomen: soft, non-tender; bowel sounds normal; no masses,  no organomegaly.  Well healed Pfannenstiel incision   Vulva:  normal  Vagina: normal vagina, no discharge, exudate, lesion, or erythema. Well-healed mucosa, no residual stitches.  Cervix:  no cervical motion tenderness and no lesions  Corpus: normal size, contour, position, consistency, mobility, non-tender  Adnexa:  normal adnexa and no mass,  fullness, tenderness  Rectal Exam: No masses or defects palpable .          Labs:  Lab Results  Component Value Date   HGB 12.5 05/10/2023     Assessment:   1. Postpartum care following vaginal delivery   2. Perineal laceration complicating delivery   3. Lactating mother   4. Nexplanon insertion      Plan:    1. Contraception: Nexplanon. Desires insertion today, see procedure note.  2. Well healed perineal/vaginal laceration. Discussed need for further management with pelvic floor physical therapy as likely weakened pelvic floor with vaginal deliveries and thinning of vaginal wall (however felt somewhat thicker today). Referral placed.  3. Doing well with lactation. Is feeding on demand. Has good supply.  4. Follow up in: 4-6 months for annual exam.      Nexplanon Insertion Procedure Patient identified, informed consent performed, consent signed.   Patient does understand that irregular bleeding is a very common side effect of this medication. She was advised to have backup contraception for one week after placement. Pregnancy test in clinic today was negative.  Appropriate time out taken.  Patient's left arm was prepped and draped in the usual sterile fashion. The ruler used to measure and mark insertion area.  Patient was prepped with alcohol swab and then injected with 3 ml of 1% lidocaine.  She was prepped with betadine, Nexplanon removed from packaging,  Device confirmed in needle, then inserted full length of needle and withdrawn per handbook instructions. Nexplanon was able to palpated in the patient's arm; patient palpated the insert  herself. There was minimal blood loss.  Patient insertion site covered with guaze and a pressure bandage to reduce any bruising.  The patient tolerated the procedure well and was given post procedure instructions.    Lot: [id number] Exp: 01/2025   Hildred Laser, MD [facility]

## 2023-06-18 ENCOUNTER — Encounter: Payer: Self-pay | Admitting: Obstetrics and Gynecology

## 2023-06-18 ENCOUNTER — Ambulatory Visit: Payer: Medicaid Other | Admitting: Obstetrics and Gynecology

## 2023-06-18 DIAGNOSIS — Z30017 Encounter for initial prescription of implantable subdermal contraceptive: Secondary | ICD-10-CM | POA: Diagnosis not present

## 2023-06-18 MED ORDER — ETONOGESTREL 68 MG ~~LOC~~ IMPL
68.0000 mg | DRUG_IMPLANT | Freq: Once | SUBCUTANEOUS | Status: AC
Start: 2023-06-18 — End: 2023-06-18
  Administered 2023-06-18: 68 mg via SUBCUTANEOUS

## 2024-06-07 ENCOUNTER — Ambulatory Visit: Admitting: Family Medicine
